# Patient Record
Sex: Female | Born: 1935 | Race: Black or African American | Hispanic: No | Smoking: Never smoker
Health system: Southern US, Community
[De-identification: ages and names within clinical notes are randomized; demographics above are authoritative.]

## PROBLEM LIST (undated history)

## (undated) DIAGNOSIS — N3281 Overactive bladder: Secondary | ICD-10-CM

## (undated) DIAGNOSIS — H409 Unspecified glaucoma: Secondary | ICD-10-CM

## (undated) DIAGNOSIS — I10 Essential (primary) hypertension: Secondary | ICD-10-CM

## (undated) DIAGNOSIS — E785 Hyperlipidemia, unspecified: Secondary | ICD-10-CM

## (undated) DIAGNOSIS — G309 Alzheimer's disease, unspecified: Secondary | ICD-10-CM

## (undated) DIAGNOSIS — F0280 Dementia in other diseases classified elsewhere without behavioral disturbance: Secondary | ICD-10-CM

## (undated) HISTORY — PX: ABDOMINAL HYSTERECTOMY: SHX81

---

## 1997-07-15 ENCOUNTER — Ambulatory Visit (HOSPITAL_COMMUNITY): Admission: RE | Admit: 1997-07-15 | Discharge: 1997-07-15 | Payer: Self-pay | Admitting: Ophthalmology

## 1997-09-01 ENCOUNTER — Ambulatory Visit (HOSPITAL_COMMUNITY): Admission: RE | Admit: 1997-09-01 | Discharge: 1997-09-01 | Payer: Self-pay | Admitting: Ophthalmology

## 1998-05-25 ENCOUNTER — Ambulatory Visit (HOSPITAL_COMMUNITY): Admission: RE | Admit: 1998-05-25 | Discharge: 1998-05-25 | Payer: Self-pay | Admitting: Unknown Physician Specialty

## 1998-06-19 ENCOUNTER — Encounter: Admission: RE | Admit: 1998-06-19 | Discharge: 1998-07-04 | Payer: Self-pay | Admitting: Orthopedic Surgery

## 1999-05-22 ENCOUNTER — Other Ambulatory Visit: Admission: RE | Admit: 1999-05-22 | Discharge: 1999-05-22 | Payer: Self-pay | Admitting: Internal Medicine

## 1999-09-07 ENCOUNTER — Encounter: Payer: Self-pay | Admitting: Ophthalmology

## 1999-09-13 ENCOUNTER — Ambulatory Visit (HOSPITAL_COMMUNITY): Admission: RE | Admit: 1999-09-13 | Discharge: 1999-09-13 | Payer: Self-pay | Admitting: Ophthalmology

## 1999-11-15 ENCOUNTER — Ambulatory Visit (HOSPITAL_COMMUNITY): Admission: RE | Admit: 1999-11-15 | Discharge: 1999-11-15 | Payer: Self-pay

## 1999-11-15 ENCOUNTER — Encounter: Payer: Self-pay | Admitting: Neurology

## 1999-11-15 ENCOUNTER — Encounter: Admission: RE | Admit: 1999-11-15 | Discharge: 1999-11-15 | Payer: Self-pay | Admitting: Neurology

## 2000-02-18 ENCOUNTER — Encounter: Payer: Self-pay | Admitting: Neurology

## 2000-02-18 ENCOUNTER — Ambulatory Visit (HOSPITAL_COMMUNITY): Admission: RE | Admit: 2000-02-18 | Discharge: 2000-02-18 | Payer: Self-pay | Admitting: Neurology

## 2000-03-04 ENCOUNTER — Encounter: Payer: Self-pay | Admitting: Neurology

## 2000-03-04 ENCOUNTER — Ambulatory Visit (HOSPITAL_COMMUNITY): Admission: RE | Admit: 2000-03-04 | Discharge: 2000-03-04 | Payer: Self-pay | Admitting: Neurology

## 2000-03-19 ENCOUNTER — Ambulatory Visit (HOSPITAL_COMMUNITY): Admission: RE | Admit: 2000-03-19 | Discharge: 2000-03-19 | Payer: Self-pay | Admitting: Neurology

## 2000-03-19 ENCOUNTER — Encounter: Payer: Self-pay | Admitting: Neurology

## 2000-04-27 ENCOUNTER — Emergency Department (HOSPITAL_COMMUNITY): Admission: EM | Admit: 2000-04-27 | Discharge: 2000-04-28 | Payer: Self-pay | Admitting: Emergency Medicine

## 2000-04-28 ENCOUNTER — Encounter: Payer: Self-pay | Admitting: Emergency Medicine

## 2000-05-13 ENCOUNTER — Encounter: Payer: Self-pay | Admitting: Neurology

## 2000-05-13 ENCOUNTER — Encounter: Admission: RE | Admit: 2000-05-13 | Discharge: 2000-05-13 | Payer: Self-pay | Admitting: Neurology

## 2000-10-06 ENCOUNTER — Encounter: Admission: RE | Admit: 2000-10-06 | Discharge: 2001-01-04 | Payer: Self-pay | Admitting: Neurology

## 2000-11-11 ENCOUNTER — Encounter: Payer: Self-pay | Admitting: Neurology

## 2000-11-11 ENCOUNTER — Encounter: Admission: RE | Admit: 2000-11-11 | Discharge: 2000-11-11 | Payer: Self-pay | Admitting: Neurology

## 2001-05-12 ENCOUNTER — Other Ambulatory Visit: Admission: RE | Admit: 2001-05-12 | Discharge: 2001-05-12 | Payer: Self-pay | Admitting: Obstetrics and Gynecology

## 2001-10-26 ENCOUNTER — Encounter: Admission: RE | Admit: 2001-10-26 | Discharge: 2001-10-26 | Payer: Self-pay | Admitting: Neurosurgery

## 2001-10-26 ENCOUNTER — Encounter: Payer: Self-pay | Admitting: Pain Medicine

## 2001-12-24 ENCOUNTER — Ambulatory Visit (HOSPITAL_COMMUNITY): Admission: RE | Admit: 2001-12-24 | Discharge: 2001-12-24 | Payer: Self-pay | Admitting: Gastroenterology

## 2002-08-25 ENCOUNTER — Other Ambulatory Visit: Admission: RE | Admit: 2002-08-25 | Discharge: 2002-08-25 | Payer: Self-pay | Admitting: Obstetrics and Gynecology

## 2002-09-10 ENCOUNTER — Ambulatory Visit (HOSPITAL_COMMUNITY): Admission: RE | Admit: 2002-09-10 | Discharge: 2002-09-10 | Payer: Self-pay | Admitting: Internal Medicine

## 2002-09-10 ENCOUNTER — Encounter: Payer: Self-pay | Admitting: Internal Medicine

## 2003-06-24 ENCOUNTER — Encounter: Admission: RE | Admit: 2003-06-24 | Discharge: 2003-06-24 | Payer: Self-pay | Admitting: Neurology

## 2004-01-15 ENCOUNTER — Emergency Department (HOSPITAL_COMMUNITY): Admission: EM | Admit: 2004-01-15 | Discharge: 2004-01-15 | Payer: Self-pay | Admitting: Emergency Medicine

## 2004-09-18 ENCOUNTER — Emergency Department (HOSPITAL_COMMUNITY): Admission: EM | Admit: 2004-09-18 | Discharge: 2004-09-18 | Payer: Self-pay | Admitting: Emergency Medicine

## 2005-01-30 ENCOUNTER — Encounter: Admission: RE | Admit: 2005-01-30 | Discharge: 2005-01-30 | Payer: Self-pay | Admitting: Neurology

## 2005-06-12 ENCOUNTER — Other Ambulatory Visit: Admission: RE | Admit: 2005-06-12 | Discharge: 2005-06-12 | Payer: Self-pay | Admitting: Obstetrics and Gynecology

## 2005-07-01 ENCOUNTER — Encounter: Admission: RE | Admit: 2005-07-01 | Discharge: 2005-07-01 | Payer: Self-pay | Admitting: Cardiology

## 2005-11-29 ENCOUNTER — Encounter: Admission: RE | Admit: 2005-11-29 | Discharge: 2005-11-29 | Payer: Self-pay | Admitting: Internal Medicine

## 2007-03-04 ENCOUNTER — Inpatient Hospital Stay (HOSPITAL_COMMUNITY): Admission: EM | Admit: 2007-03-04 | Discharge: 2007-03-07 | Payer: Self-pay | Admitting: Emergency Medicine

## 2007-03-04 ENCOUNTER — Ambulatory Visit: Payer: Self-pay | Admitting: Cardiology

## 2007-03-04 ENCOUNTER — Ambulatory Visit: Payer: Self-pay | Admitting: Cardiovascular Disease

## 2007-03-04 ENCOUNTER — Encounter (INDEPENDENT_AMBULATORY_CARE_PROVIDER_SITE_OTHER): Payer: Self-pay | Admitting: *Deleted

## 2007-03-04 ENCOUNTER — Ambulatory Visit: Payer: Self-pay | Admitting: *Deleted

## 2007-09-17 ENCOUNTER — Emergency Department (HOSPITAL_COMMUNITY): Admission: EM | Admit: 2007-09-17 | Discharge: 2007-09-17 | Payer: Self-pay | Admitting: Pediatrics

## 2009-06-28 ENCOUNTER — Emergency Department (HOSPITAL_COMMUNITY): Admission: EM | Admit: 2009-06-28 | Discharge: 2009-06-28 | Payer: Self-pay | Admitting: Emergency Medicine

## 2010-02-16 ENCOUNTER — Emergency Department (HOSPITAL_COMMUNITY): Admission: EM | Admit: 2010-02-16 | Discharge: 2010-02-16 | Payer: Self-pay | Admitting: Emergency Medicine

## 2010-04-29 ENCOUNTER — Encounter: Payer: Self-pay | Admitting: Internal Medicine

## 2010-06-19 LAB — URINALYSIS, ROUTINE W REFLEX MICROSCOPIC
Hgb urine dipstick: NEGATIVE
Ketones, ur: NEGATIVE mg/dL
Protein, ur: NEGATIVE mg/dL
Urobilinogen, UA: 0.2 mg/dL (ref 0.0–1.0)

## 2010-06-19 LAB — DIFFERENTIAL
Eosinophils Absolute: 0.1 10*3/uL (ref 0.0–0.7)
Eosinophils Relative: 2 % (ref 0–5)
Lymphocytes Relative: 40 % (ref 12–46)
Monocytes Relative: 9 % (ref 3–12)
Neutro Abs: 1.9 10*3/uL (ref 1.7–7.7)

## 2010-06-19 LAB — CBC
HCT: 39.9 % (ref 36.0–46.0)
Hemoglobin: 13.5 g/dL (ref 12.0–15.0)
MCH: 31 pg (ref 26.0–34.0)
Platelets: 223 10*3/uL (ref 150–400)
RBC: 4.35 MIL/uL (ref 3.87–5.11)
RDW: 15.1 % (ref 11.5–15.5)

## 2010-06-19 LAB — URINE MICROSCOPIC-ADD ON

## 2010-06-19 LAB — BASIC METABOLIC PANEL
BUN: 11 mg/dL (ref 6–23)
GFR calc Af Amer: >60 mL/min (ref >60)
GFR calc non Af Amer: >60 mL/min (ref >60)

## 2010-07-02 LAB — CBC
Platelets: 235 10*3/uL (ref 150–400)
RBC: 4.17 MIL/uL (ref 3.87–5.11)
RDW: 14.8 % (ref 11.5–15.5)
WBC: 3.8 10*3/uL — ABNORMAL LOW (ref 4.0–10.5)

## 2010-07-02 LAB — POCT CARDIAC MARKERS
CKMB, poc: <1 ng/mL — ABNORMAL LOW (ref 1.0–8.0)
Myoglobin, poc: 44.6 ng/mL (ref 12–200)
Troponin i, poc: <0.05 ng/mL (ref 0.00–0.09)

## 2010-07-02 LAB — COMPREHENSIVE METABOLIC PANEL
CO2: 25 mEq/L (ref 19–32)
Chloride: 104 mEq/L (ref 96–112)
GFR calc Af Amer: >60 mL/min (ref >60)
Glucose, Bld: 120 mg/dL — ABNORMAL HIGH (ref 70–99)
Sodium: 135 mEq/L (ref 135–145)

## 2010-07-02 LAB — DIFFERENTIAL
Basophils Relative: 1 % (ref 0–1)
Lymphs Abs: 1.5 10*3/uL (ref 0.7–4.0)
Monocytes Absolute: 0.3 10*3/uL (ref 0.1–1.0)
Monocytes Relative: 8 % (ref 3–12)
Neutro Abs: 2 10*3/uL (ref 1.7–7.7)

## 2010-08-06 DIAGNOSIS — F0390 Unspecified dementia without behavioral disturbance: Secondary | ICD-10-CM

## 2010-08-21 NOTE — Discharge Summary (Signed)
NAMERASHAUNA, TEP             ACCOUNT NO.:  [id number]   MEDICAL RECORD NO.:  [id number]          PATIENT TYPE:  INP   LOCATION:  4730                         FACILITY:  [facility]   PHYSICIAN:  Mobolaji B. Bakare, M.D.DATE OF BIRTH:  1922/03/14   DATE OF ADMISSION:  03/04/2007  DATE OF DISCHARGE:  03/07/2007                               DISCHARGE SUMMARY   PRIMARY CARE PHYSICIAN:  Olene Craven, M.D.   FINAL DIAGNOSIS:  1. Headache unclear etiology. Probably related to intra- articular      steroid injection  2. Chest pain with mildly elevated troponin probably secondary to      hypertensive crisis.  3. Hypertensive crisis.  4. History of left cerebellopontine angle acoustic neuroma.  5. Glaucoma.  6. Osteoarthritis.  7. Sinus bradycardia asymptomatic.   PROCEDURES:  1. Head CT scan done on the March 04, 2007 showed no acute      intracranial abnormality, a stable tiny punctate bilateral lacunar      infarct of the basilar ganglia, and  stable periventricular white      matter disease.  2. CT angiogram of the chest showed by the basilar atelectasis, no      evidence of pulmonary embolism or other acute thoracic process.  3. MRA of the head showed mild intracranial atherosclerotic-type      changes.  4. MRI of the head showed no acute infarct, residual of left-sided      acoustic neuroma as a relatively similar appearance to MRI of 2006.      Chest x-ray done on February 12, 2007, mL showed cardiomegaly      without pulmonary edema, bibasilar atelectasis.  5. A 2-D echocardiogram done on the February 12, 2007,  showed normal      left ventricular systolic function ejection fraction estimated to      be 55%.  Left ventricular wall thickness was mildly increased,      mildly calcified aortic valve with mild aortic valvular      regurgitation, and mild mitral valvular regurgitation.   CONSULTS:  Cardiology consult.   BRIEF HISTORY:  Please refer to the admission H&P.   In brief, Ms. Blackard  is an 77 year old African-American female who lives independently at  home.  She presented to the emergency room on the day of admission with  severe headache.  She apparently started her day with a clinic  evaluation by Dr. August Saucer, on this particular day, for osteoarthritis.  She  had intra-articular injection of steroid to her left hip.  On arrival at  home, the patient developed slight headache.  She took some aspirin and  went to bed.  She was woken up by a very severe headache on this day, at  about 2:00 a.m. which persisted when she arrived in the emergency room.  Headache improved after IV morphine.  It was noted, on arrival in the  emergency room, and also by the EMS that her blood pressure was  elevated.  Blood pressure on arrival was 201/113.  This resolved after  IV morphine and labetalol.  The patient had a head CT  scan which was  unrevealing for any acute abnormality.  She was admitted for further  evaluation with an MRI of the head.   Additionally, the patient during transportation by EMS developed chest  pressure associated with some shortness of breath, and there were no EKG  changes.  Skilled cardiac markers at the point of care were normal.  The  patient was also admitted to rule out MI.  D-dimer was requested.   HOSPITAL COURSE:  Problem #1:  Headaches. This headache was quite severe  that she rated it as 10/10 when it started, but this resolved after IV  morphine.  She had a head CT scan which did not show any subarachnoid  bleed.  This was followed by an MRI which showed no acute findings,  although she has history of left acoustic neuroma which has been treated  with gamma knife radiation.  She has had a follow-up evaluation in [place] this past summer, and everything was said to be stable.  It  should be mentioned that evaluation in the emergency room with a  tonometry revealed increased intraocular pressure of 30 mm bilaterally.  The  patient denies any eye pain.  She does have history of glaucoma, and  she has been on her medication.  She had no changes in vision during  this whole episode.  I have advised her to see Dr. Elmer Picker  (ophthalmologist) early next week post discharge.  There was no aneurysm  noted on MRA.  The patient had no neck stiffness no rigidity, fever, or  elevated white cell count to suggest an infection; hence, this was not  pursued further.  Symptoms resolved in the emergency room.  This raises  the question of having had a reaction to the intra-articular steroid.  I  have tried contacting Dr. August Saucer, it was a holiday weekend.  I have asked  the patient to followup with him, and mention these events during her  next visit.   Problem #2:  HYPERTENSION/HYPERTENSIVE CRISIS.  The patient denies any  history of hypertension.  She is not on any antihypertensive.  Her blood  pressure was quite elevated during the episode of severe headache.  On  initial arrival in the emergency room blood pressure was 201/113.  At  the time of my evaluation, blood pressure was 145/90.  This was after  she received IV morphine and labetalol 20 mg IV.  Subsequently during  the course of hospitalization, blood pressure has ranged between April 30, 1975 to 145/86.  The patient was seen in consultation by Dr. Daleen Squibb.  She has mild mildly increased left ventricular wall thickness.  She has  LVH by voltage criteria EKG it was felt that she should be a low  threshold to treat for hypertension with low dose hydrochlorothiazide.  This was discussed with the patient.  She is reluctant to start  antihypertensive, now, she claims that she has always checked her blood  pressure at home and it has been normal. It was suggested that she  should follow up with Dr. Barbee Shropshire and have this rechecked, again, and  will defer decision to start antihypertensive to Dr. Barbee Shropshire.   Problem #3:  CHEST PAIN WITH SLIGHTLY ELEVATED TROPONIN.  The  patient  was evaluated with a D-dimer, and this turned out to be elevated.  She  underwent a CT angiogram of the chest to rule out PE or dissection which  was felt to be less likely.  CT angiogram of the  chest was normal.  She  had serial cardiac enzymes which revealed slightly increased CK-MB of  3.1 and troponin of 0.11 with the first set, and the third set trended  down with a troponin 0.06 and CK-MB of 2.6.  This prompted cardiology  consult, and Dr. Daleen Squibb opinioned that this was probably a nonobstructive  CAD, and the bump in troponin was probably secondary to hypertensive  crisis, and not acute coronary syndrome.  He felt that well one should  have a low threshold to hypertension given all these data, and the  patient should be on Ecasa 81 mg daily; and  no need for stress test at  this time.   Initial EKG was unrevealing for acute ST changes.  There was normal  sinus rhythm with LVH by voltage criteria.  QTC interval was normal.  The patient was noted to have resting sinus bradycardia while on  telemetry.   The patient had a fasting lipid profile which showed total cholesterol  of 195, LDL of 137, HDL of 51.  Given other risk factors the goals  should be less than 130.  The patient agrees to use diet to control the  cholesterol, at this point.  She should have a repeat fasting lipid  profile checked in 3 months.   Problem #4:  GLAUCOMA.  The patient is to followup with her  ophthalmologist early next week.   DISCHARGE MEDICATIONS:  1. Vesicare 5 mg nightly  2. Namenda 10 mg b.i.d.  3. Alphagan 1 drop each eye t.i.d.  4. Lumigan 0.03% nightly.  5. Trusopt 1 drop b.i.d.  6. Tramadol p.r.n.  7. Ecasa 81 mg daily.   FOLLOWUP:  With Dr. Barbee Shropshire in 1 week.   RECOMMENDATIONS:  1. Check first lipid profile in 3 months.  2. Reevaluate blood pressure and need for an antihypertensive.   DISCHARGE CONDITION:  Stable.  The patient ambulating without  difficulty.  Symptoms are  all resolved.   VITALS AT THE TIME OF DISCHARGE:  Blood pressure 145/86, respiratory  rate of 18, pulse of 56, temperature 98.1 and O2 saturation 99% on room  air.      Mobolaji B. Corky Downs, M.D.  Electronically Signed     MBB/MEDQ  D:  03/07/2007  T:  03/07/2007  Job:  [id number]   cc:   Olene Craven, M.D.  Burnard Bunting, M.D.  Candyce Churn. Allyne Gee, M.D.

## 2010-08-21 NOTE — Consult Note (Signed)
NAMEJAYLAN, Gregory             ACCOUNT NO.:  [id number]   MEDICAL RECORD NO.:  [id number]          PATIENT TYPE:  INP   LOCATION:  4730                         FACILITY:  [facility]   PHYSICIAN:  Robin Sans. Wall, MD, FACCDATE OF BIRTH:  [birth year]   DATE OF CONSULTATION:  DATE OF DISCHARGE:                                 CONSULTATION   We were asked by Dr. Wilhemina Gregory of the [facility] service to evaluate  Robin Gregory with some mild increase in her troponins.   HISTORY OF PRESENT ILLNESS:  Ms.  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   She woke up about 2:00 a.m. with severe throbbing headache.  She had no  other associated symptoms except for some mild chest pressure en route  to the hospital.  Her blood pressure was next initially 180 and then it  was 201/113.  It has now come down and has been under good control since  then.  She is on no antihypertensives at present.   Her cardiac markers have shown normal CPK MB but mildly elevated  troponins.  Her peak troponin was 0.11 with her last troponin being  0.06.  Note the C-reactive protein was low as was her sed rate.  Her  hemoglobin A1c was also normal.  Her BNP was elevated at 258 when she  first appeared.  D-dimer was elevated but a CT scan showed no evidence  of pulmonary emboli.  Chest x-ray shows some minimal cardiomegaly.  EKG  shows LVH moderate criteria.  Her 2-D echocardiogram shows mild LVH with  mild mitral regurgitation and mild aortic insufficiency.  Her left  ventricular walls measure 11.8 and 10.2.  She had normal left  ventricular systolic function.   PAST MEDICAL HISTORY:  She denies any history of hypertension.  She has  a history of polyarticular arthritis.  She had a posterior fossa  meningioma diagnosed to 2001.  She underwent gamma knife radiation at  [facility].  An MRI scan here show the  residua of this tumor.  She has a history of glaucoma, dementia, urinary  incontinence.   MEDICATIONS ON ADMISSION:  1. Vesicare 5 mg q.h.s.  2. Tramadol p.r.n.  3. Namenda 10 mg p.o. b.i.d.  4. Alphagan 1 drop in each eye 3 times a day.  5. Lumigan 0.03% q.h.s.  6. Trusopt 2 times a day.   ALLERGIES:  NUBAIN CAUSES SWELLING.   FAMILY HISTORY:  Noncontributory.   SOCIAL HISTORY:  Lives at home.  She is independent.  She is very active  in her church.  She does not smoke or drink.  She apparently uses a lot  of salt according to her daughters who are in the room.   REVIEW OF SYSTEMS:  Her review of systems other than HPI are negative.  She has no exertional angina at home.  She has no limitations from my  shortness of breath.   PHYSICAL EXAMINATION:  VITAL SIGNS:  Her current exam shows blood  pressure 133/80, pulse is 57 and she is in sinus rhythm, sinus brady,  temperature is 97.6, respirations 18, O2 sats 96%.  HEENT:  Normocephalic, atraumatic.  PERLA.  Extraocular movements  intact.  Sclerae are clear.  Face symmetry is normal.  GENERAL:  She is alert and oriented.  NECK:  Carotid upstrokes are equal bilateral without bruits.  No JVD.  Thyroid is not enlarged.  Trachea is midline.  LUNGS:  Clear.  HEART:  Reveals a dynamic PMI.  She has a normal S1 without an S4.  ABDOMEN:  Abdominal exam is soft, good bowel sounds.  No midline bruit.  EXTREMITIES:  No cyanosis, clubbing or edema.  Pulses are rather brisk  2+/4+ bilaterally symmetrical.  NEUROLOGIC:  Neuro exam is grossly intact.   ASSESSMENT:  1. Minimal increase in troponins with normal CPK-MB consistent with      myocardial strain from hypertensive crisis.  I do not think this is      acute coronary syndrome.  She is totally asymptomatic from an      exertional standpoint as well.  2. Probable labile hypertension.  Her EKG and echocardiogram would       support this.  3. Dietary indiscretion.  4. Probable nonobstructive coronary disease at her age.   I had a long talk with her and her family.  She clearly does not want a  stress test nor does her family want her to have one.  I am not sure  this would add anything at all to her current management.  She knows  what the symptoms of angina and knows to report them.   I would treat her with aspirin 81 mg a day.  I think her blood pressure  probably needs be treated as well and I would start with something like  HCTZ 12.5 mg a day.  She this cleared from cardiology standpoint to go  home.      Robin C. Daleen Squibb, MD, [facility]  Electronically Signed     TCW/MEDQ  D:  03/06/2007  T:  03/06/2007  Job:  [id number]   cc:   Robin Gregory, M.D.

## 2010-08-21 NOTE — H&P (Signed)
NAME:  Robin Gregory, Robin Gregory             ACCOUNT NO.:  [id number]   MEDICAL RECORD NO.:  [id number]          PATIENT TYPE:  EMS   LOCATION:  MAJO                         FACILITY:  [facility]   PHYSICIAN:  Mobolaji B. Bakare, M.D.DATE OF BIRTH:  1921/05/12   DATE OF ADMISSION:  03/04/2007  DATE OF DISCHARGE:                              HISTORY & PHYSICAL   PRIMARY CARE PHYSICIAN:  Olene Craven, M.D.   ORTHOPEDIC SURGEON:  Burnard Bunting, M.D.   OPHTHALMOLOGIST:  Delon Sacramento, M.D.   CHIEF COMPLAINT:  Headache.   HISTORY OF PRESENTING COMPLAINT:  Robin Gregory is a pleasant, 77 year old  African-American female, who was in her usual state of health until  yesterday evening, when she developed headache, both frontal regions.  She went to sleep with this headache.  Unfortunately, she was awakened  about 2 a.m. with severe throbbing headache, which she rated as 15/10.  There were no associated changes in her vision, no nausea or vomiting.  There was no neck pain, no photophobia or phonophobia.  Patient called  EMS and she was brought to the hospital.  Prior to arrival by EMS,  patient checked out blood pressure.  It was 180 systolic and arrival by  EMS, blood pressure was still elevated.  While in the emergency room,  blood pressure was initially 201/113.  She has been medicated with IV  labetalol and morphine.  Blood pressure is now 145/90.  She still has  headache, but it is much better.  She rates it as 7/10.   En route to the hospital, while in the ambulance, patient developed  retrosternal chest pressure, associated with some shortness of breath,  nausea, but no diaphoresis, palpitations, or tingling sensation.  Patient had a normal EKG in the emergency room without acute ST changes.  Full set of cardiac markers at the point of care was normal.  Chest  pressure is almost gone.   Patient was seen at Dr. Diamantina Providence office yesterday for a sprain in her left  ankle.  She accidentally  fell and sprained her left ankle.  She got a  cortisone shot to this ankle and left hip.  She has history of severe  arthritis.   REVIEW OF SYSTEMS:  She denies eye pain.  No vomiting, diarrhea,  constipation, orthopnea, PND.  No dysuria, urgency, or increased  frequency of micturition.   PAST MEDICAL HISTORY:  Significant for:  1. Polyarticular arthritis.  2. Posterior fossa meningioma, diagnosed in 2001.  She underwent gamma      knife radiation at [facility] [place] [employer] [facility].  She      had a followup evaluation this past summer and patient states that      everything was okay.  She still has small meningioma, though.  3. Glaucoma.  4. Dementia.  5. Urinary incontinence.   CURRENT MEDICATIONS:  1. Vesicare 5 mg q.h.s.  2. Tramadol p.r.n.  This was started yesterday by Dr. August Saucer.  3. Namenda 10 mg b.i.d.  4. Alphagan P one drop each eye three times a day.  5. Lumigan 0.03% q.h.s.  6. Trusopt two  times a day.   ALLERGIES:  NUBAIN causes swelling.   FAMILY HISTORY:  Noncontributory.  She has a daughter who has  meningioma, which is also being followed up.   SOCIAL HISTORY:  Patient lives at home.  She is independent with  activities of daily living.  She does not smoke cigarettes or drink  alcohol.   INITIAL VITALS:  Temperature 97.1, blood pressure 201/113, now 145/90,  pulse of 86, respiratory rate 18, O2 sats 97% to 100% on room air.  EXAMINATION:  Patient is awake, alert and oriented in time, place and  person.  Normocephalic, atraumatic head.  Pupils were equal, round and reactive  to light.  She has fairly opaque sclerae.  Neck:  No elevated JVD, no  carotid bruit.  Mucous membranes moist, no oral thrush.  Patient has no  neck stiffness.  LUNGS:  Clear clinically to auscultation.  CVS:  S1, S2.  Regular.  ABDOMEN:  Not distended, soft, nontender, bowel sounds present.  EXTREMITIES:  There is swelling, left calf, and pitting edema involving  left ankle  and swelling involving left ankle, which is tender.  She has  a crepe bandage around the left ankle.  Dorsalis pedis pulses 2+  bilaterally.  CNS:  No focal neurological deficit.  Cranial nerves II through XII are  intact.  Motor power 5/5 in all limbs, no pronator drift.   RADIOLOGICAL DATA:  Chest x-ray showed cardiomegaly, no edema, bibasilar  atelectasis.   CT scan of the head showed no acute intracranial abnormalities, stable  small bilateral remote lacunar infarct of basal ganglia.  Stable  periventricular white matter disease.   INITIAL LABORATORY DATA:  White cell count 6.6, hemoglobin 14.1,  hematocrit 41.2, platelets 238, neutrophils 84%, absolute neutrophil  count is normal.  Cardiac markers at the point of care:  CK-MB 1.8,  myoglobin 116.  Sodium 139, potassium 3.8, chloride 107, glucose 145,  BUN 17, creatinine 0.8, bicarb 20.   EKG shows normal sinus rhythm with heart rate of 83 beats per minute,  normal interval.   ASSESSMENT AND PLAN:  Robin Gregory is an 77 year old African-American  female, presenting with severe headaches, elevated blood pressure.  She  has no acute abnormality on head CT scan.  She will be admitted for  further evaluation and treatment.   ADMISSION DIAGNOSIS:  1. Severe headache:  Etiology of this is unclear.  It is hard to      conclude that this is secondary to the severely elevated blood      pressure.  Patient has no history of hypertension prior to this      hospitalization.  Blood pressure has been well-controlled with pain      medication and IV labetalol.  We will check sed rates to rule out      temporal arteritis.  MRI, and MRA of the head to further assess      remaining tumor and also rule out a new lesion.  We will give      symptomatic treatment with Tylenol and Vicodin p.r.n.  If no      improvement, would consult neurology.  2. Elevated blood pressure:  We will monitor blood pressure while in      the hospital.  Should this  remain persistently elevated, will start      antihypertensive, will control pain and assess blood pressure      again.  3. Left-ankle sprain with left-calf swelling:  Will rule out lower  extremity DVT, obtain left lower extremity Dopplers.  Will continue      pain management for the sprain.  4. Chest pain:  She has a normal EKG.  Will cycle cardiac enzymes.      Check D-dimer.  If this is positive, we will obtain a CT scan of      the chest.  Check fasting lipid profile, hemoglobin A1c and 2D      echocardiogram.  5. Glaucoma, both eyes:  Will resume Lumigan, Alphagan and Trusopt.  6. Dementia:  We will resume Namenda.      Mobolaji B. Corky Downs, M.D.  Electronically Signed     MBB/MEDQ  D:  03/04/2007  T:  03/04/2007  Job:  [id number]   cc:   Olene Craven, M.D.  Burnard Bunting, M.D.  Delon Sacramento, M.D.

## 2010-08-24 NOTE — Op Note (Signed)
[place]  Patient:    Robin Gregory, Robin Gregory                    MRN: [id number] Adm. Date:  [id number] Disc. Date: [id number] Attending:  Karenann Cai                           Operative Report  PREOPERATIVE DIAGNOSIS:  Immature cataract, right eye.  POSTOPERATIVE DIAGNOSIS:  Immature cataract, right eye.  OPERATION:  Kelman phacoemulsification cataract, right eye with intraocular lens implantation.  ANESTHESIA:  Local using 2% Xylocaine with Marcaine 0.75% and Wydase.  JUSTIFICATION FOR PROCEDURE:  This is a 77 year old lady who complains of blurring of vision with difficulty seeing to read and difficulty seeing to drive at night.  She was evaluated and found to have bilateral cataracts, worse on the left than the right.  Vision acuity was best corrected to 20/50 on the right and 20/40 on the left.  Cataract extraction of the right eye was recommended, and she is admitted at this time for the proposed procedure.  DESCRIPTION OF PROCEDURE:  Under the influence of IV sedation, the Van Lint akinesia and retrobulbar anesthesia were given.  The patient was prepped and draped in the usual manner.  The lid speculum was inserted under the upper and lower lids of the right eye, and a full silk traction suture was passed through the belly of the superior rectus muscle with traction.  A fornix-based conjunctival flap was turned, and hemostasis was achieved by using cautery. An incision was made in the sclera, approximately 1 mm posterior to the limbus. This incision was dissected down into clear cornea using the crescent blade.  A stab wound incision was made at the 1:30 oclock position.  OcuCoat was injected into the eye through the stab wound incision.  The anterior chamber was then entered through the corneoscleral tunnel incision at the 11:30 oclock position.  An anterior capsulotomy was done using a bent 25-gauge needle.  The nucleus was  hydrodissected using Xylocaine.  The ______ handpiece was passed into the eye, and the nucleus was emulsified without difficulty.  The residual ______ was aspirated.  The posterior capsule was polished using the olive tip polisher.  The wound was then widened slightly to accommodate a 5 x 6 orbital intraocular lens.  This lens was seated into the eye behind the iris without difficulty.  The anterior chamber was reformed, and the pupil was constricted using Miochol.  The corneosclera wound was then closed by using a single horizontal suture of 10-0 nylon.  After it was ascertained that the wound was airtight and watertight, the conjunctiva was closed using thermocautery.  Then 1 cc of Celestone and a 0.5 cc of gentamicin were injected into the subconjunctivae.  Maxitrol Ophthalmic ointment and Polocaine ointment were applied, along was a patch and a Fox shield.  The patient tolerated the procedure well and was discharged to the post anesthesia recovery room in satisfactory condition.  She is to be discharged when stable with instructions to rest in bed today, take Vicodin q.4h. as needed for the pain, and to see me in the office tomorrow for further evaluation.  DISCHARGE DIAGNOSIS:  Immature cataract, right eye. DD:  09/13/99 TD:  09/18/99 Job: [id number]

## 2010-08-24 NOTE — Op Note (Signed)
NAME:  Robin Gregory, Robin Gregory                       ACCOUNT NO.:  [id number]   MEDICAL RECORD NO.:  [id number]                   PATIENT TYPE:  AMB   LOCATION:  ENDO                                 FACILITY:  [facility]   PHYSICIAN:  Danise Edge, MD                  DATE OF BIRTH:  [birth year]   DATE OF PROCEDURE:  12/24/2001  DATE OF DISCHARGE:                                 OPERATIVE REPORT   PROCEDURE PERFORMED:  Colonoscopy.   ENDOSCOPIST:  Charolett Bumpers, M.D.   REFERRING PHYSICIAN:  Lindell Spar. Chestine Spore, M.D.   INDICATIONS FOR PROCEDURE:  The patient is a 77 year old female with  functional type constipation which had developed since her surgery for  spinal stenosis.  Ms. Yellin tells me she has been undergoing surveillance  colonoscopies every two years since removal of a noncancerous colon polyp  years ago.  I placed her on MiraLax without significant improvement in her  functional type constipation.  A screening colonoscopy is scheduled today.   PREMEDICATION:  Fentanyl 50 mcg, Versed 5 mg.   INSTRUMENT USED:  Pediatric Olympus video colonoscope.   DESCRIPTION OF PROCEDURE:  After obtaining informed consent, the patient was  placed in the left lateral decubitus position.  I administered intravenous  fentanyl and intravenous Versed to achieve conscious sedation for the  procedure.  The patient's blood pressure, oxygen saturations and cardiac  rhythm were monitored throughout the procedure and documented in the medical  record.   Anal inspection was normal.  Digital rectal exam was normal.  The pediatric  Olympus video colonoscope was introduced into the rectum and advanced to the  cecum.   Unfortunately the patient's colonic lavage prep did not adequately prep the  colon for accurate visualization of a colonic mucosa to exclude colon  polyps.  The colon prep was adequate enough to accurately rule out colonic  obstruction.   Rectum:  Normal.   Sigmoid colon and  descending colon:  Normal.   Splenic flexure:  Normal.   Transverse colon:  Normal.   Hepatic flexure:  Normal.   Ascending colon:  Normal.   Ileocecal valve and cecum:  Normal.   ASSESSMENT:  1. Inadequately prepped colon for colonic mucosal evaluation to rule out     colonic polyps.  2. No endoscopic evidence for the presence of colonic obstruction.   RECOMMENDATIONS:  I will place the patient on Citracal and Senokot.                                                  Danise Edge, MD    MJ/MEDQ  D:  12/24/2001  T:  12/25/2001  Job:  [id number]   cc:   Lindell Spar. Chestine Spore, M.D.  [address], [address]  [place]  [place] [address]  Fax: [phone]

## 2011-01-03 LAB — URINALYSIS, ROUTINE W REFLEX MICROSCOPIC
Glucose, UA: 100 — AB
Ketones, ur: NEGATIVE
Leukocytes, UA: NEGATIVE
Nitrite: NEGATIVE
Urobilinogen, UA: 0.2

## 2011-01-03 LAB — DIFFERENTIAL
Eosinophils Absolute: 0
Eosinophils Relative: 0
Monocytes Absolute: 0.2
Monocytes Relative: 3
Neutrophils Relative %: 82 — ABNORMAL HIGH

## 2011-01-03 LAB — URINE MICROSCOPIC-ADD ON

## 2011-01-03 LAB — POCT I-STAT, CHEM 8
BUN: 18
Calcium, Ion: 1.16
Glucose, Bld: 145 — ABNORMAL HIGH
Potassium: 3.6
Sodium: 142

## 2011-01-03 LAB — CBC
Hemoglobin: 13.8
Platelets: 201
WBC: 6.3

## 2011-01-15 LAB — I-STAT 8, (EC8 V) (CONVERTED LAB)
Acid-base deficit: 3 — ABNORMAL HIGH
BUN: 17
Bicarbonate: 20.1
Glucose, Bld: 145 — ABNORMAL HIGH
Potassium: 3.8
TCO2: 21
pCO2, Ven: 31.2 — ABNORMAL LOW
pH, Ven: 7.416 — ABNORMAL HIGH

## 2011-01-15 LAB — CBC
Hemoglobin: 14.1
MCHC: 33.6
MCV: 89.3
Platelets: 238
RBC: 4.68
RDW: 14.5

## 2011-01-15 LAB — BASIC METABOLIC PANEL
BUN: 20
Chloride: 105
Creatinine, Ser: 0.94
GFR calc Af Amer: >60
GFR calc non Af Amer: 57 — ABNORMAL LOW

## 2011-01-15 LAB — HEPATIC FUNCTION PANEL
ALT: 19
AST: 22
Albumin: 3.3 — ABNORMAL LOW
Alkaline Phosphatase: 75
Total Protein: 6.1

## 2011-01-15 LAB — CARDIAC PANEL(CRET KIN+CKTOT+MB+TROPI)
Relative Index: 2.2
Relative Index: INVALID
Total CK: 103
Troponin I: 0.06
Troponin I: 0.09 — ABNORMAL HIGH

## 2011-01-15 LAB — DIFFERENTIAL
Lymphs Abs: 0.9
Monocytes Absolute: 0.1
Monocytes Relative: 1 — ABNORMAL LOW
Neutrophils Relative %: 84 — ABNORMAL HIGH

## 2011-01-15 LAB — D-DIMER, QUANTITATIVE: D-Dimer, Quant: 1.84 — ABNORMAL HIGH

## 2011-01-15 LAB — LIPID PANEL
Cholesterol: 195
HDL: 51
LDL Cholesterol: 137 — ABNORMAL HIGH

## 2011-01-15 LAB — TSH: TSH: 0.347 — ABNORMAL LOW

## 2011-01-15 LAB — HEMOGLOBIN A1C: Mean Plasma Glucose: 136

## 2011-01-15 LAB — POCT I-STAT CREATININE: Creatinine, Ser: 0.8

## 2011-01-15 LAB — POCT CARDIAC MARKERS: Troponin i, poc: <0.05

## 2012-07-05 ENCOUNTER — Encounter (HOSPITAL_COMMUNITY): Payer: Self-pay | Admitting: Emergency Medicine

## 2012-07-05 ENCOUNTER — Emergency Department (HOSPITAL_COMMUNITY)
Admission: EM | Admit: 2012-07-05 | Discharge: 2012-07-06 | Disposition: A | Discharge status: Home / Self Care | Payer: Medicare Other | Attending: Emergency Medicine | Admitting: Emergency Medicine

## 2012-07-05 DIAGNOSIS — Z7982 Long term (current) use of aspirin: Secondary | ICD-10-CM | POA: Insufficient documentation

## 2012-07-05 DIAGNOSIS — Z79899 Other long term (current) drug therapy: Secondary | ICD-10-CM | POA: Insufficient documentation

## 2012-07-05 DIAGNOSIS — R51 Headache: Secondary | ICD-10-CM | POA: Insufficient documentation

## 2012-07-05 DIAGNOSIS — J3489 Other specified disorders of nose and nasal sinuses: Secondary | ICD-10-CM | POA: Insufficient documentation

## 2012-07-05 DIAGNOSIS — Z8669 Personal history of other diseases of the nervous system and sense organs: Secondary | ICD-10-CM | POA: Insufficient documentation

## 2012-07-05 HISTORY — DX: Unspecified glaucoma: H40.9

## 2012-07-05 MED ORDER — ACETAMINOPHEN 325 MG PO TABS
650.0000 mg | ORAL_TABLET | Freq: Once | ORAL | Status: AC
  Administered 2012-07-05: 650 mg via ORAL
  Filled 2012-07-05: qty 2

## 2012-07-05 NOTE — ED Notes (Signed)
Pt from [place] c/o HA x 1.5 days. No meds administered at [facility].

## 2012-07-06 ENCOUNTER — Emergency Department (HOSPITAL_COMMUNITY): Payer: Medicare Other

## 2012-07-06 MED ORDER — ACETAMINOPHEN 500 MG PO TABS: 500.0000 mg | ORAL_TABLET | Freq: Four times a day (QID) | ORAL | Status: DC | PRN

## 2012-07-06 MED ORDER — OXYMETAZOLINE HCL 0.05 % NA SOLN
1.0000 | Freq: Once | NASAL | Status: AC
  Administered 2012-07-06: 1 via NASAL
  Filled 2012-07-06: qty 15

## 2012-07-06 MED ORDER — KETOROLAC TROMETHAMINE 30 MG/ML IJ SOLN
30.0000 mg | Freq: Once | INTRAMUSCULAR | Status: AC
  Administered 2012-07-06: 30 mg via INTRAMUSCULAR
  Filled 2012-07-06: qty 1

## 2012-07-06 MED ORDER — FLUTICASONE PROPIONATE 50 MCG/ACT NA SUSP: 2.0000 | Freq: Every day | NASAL | Status: AC

## 2012-07-06 MED ORDER — GUAIFENESIN ER 600 MG PO TB12: 600.0000 mg | ORAL_TABLET | Freq: Two times a day (BID) | ORAL | Status: DC

## 2012-07-06 MED ORDER — IBUPROFEN 400 MG PO TABS: 400.0000 mg | ORAL_TABLET | Freq: Three times a day (TID) | ORAL | Status: DC | PRN

## 2012-07-06 NOTE — ED Provider Notes (Signed)
History     CSN: [id number]  Arrival date & time 07/05/12  2307   First MD Initiated Contact with Patient 07/05/12 2356      Chief Complaint  Patient presents with  . Headache   HPI  History provided by the patient and family. Patient is 79 year old female with past history is of glaucoma who presents with complaints of continued and worsening headache symptoms. Patient reports having headache for the past few days. It was improving with Tylenol use but today and this evening has continued and worsened. She has also had some associated recent symptoms of rhinorrhea and congestion with sinus pressure in her face. Headache pain is across the 4 head bilaterally. Does not radiate to the back of the head or down. She denies any associated vision changes. No photophobia. No associated fever, chills or sweats. No nausea vomiting. No weakness or numbness in extremities. No confusion or speech difficulty. There are no other aggravating or alleviating factors. No other associated symptoms.    Past Medical History  Diagnosis Date  . Glaucoma     Past Surgical History  Procedure Laterality Date  . Abdominal hysterectomy      No family history on file.  History  Substance Use Topics  . Smoking status: Never Smoker   . Smokeless tobacco: Never Used  . Alcohol Use: No    OB History   Grav Para Term Preterm Abortions TAB SAB Ect Mult Living                  Review of Systems  Constitutional: Negative for fever and chills.  HENT: Positive for congestion, rhinorrhea and sinus pressure.   Eyes: Negative for photophobia and visual disturbance.  Respiratory: Negative for cough and shortness of breath.   Cardiovascular: Negative for chest pain.  Gastrointestinal: Negative for nausea and vomiting.  Neurological: Positive for headaches. Negative for weakness and numbness.  Psychiatric/Behavioral: Negative for confusion.  All other systems reviewed and are negative.    Allergies   Review of patient's allergies indicates no known allergies.  Home Medications   Current Outpatient Rx  Name  Route  Sig  Dispense  Refill  . acetaminophen (TYLENOL) 325 MG tablet   Oral   Take 650 mg by mouth every 6 (six) hours as needed for pain (arthritis).         Marland Kitchen aspirin 81 MG chewable tablet   Oral   Chew 81 mg by mouth daily.         . bimatoprost (LUMIGAN) 0.01 % SOLN   Both Eyes   Place 1 drop into both eyes at bedtime.         . cholecalciferol (VITAMIN D) 1000 UNITS tablet   Oral   Take 2,000 Units by mouth daily.         Marland Kitchen donepezil (ARICEPT) 10 MG tablet   Oral   Take 10 mg by mouth at bedtime as needed.         . dorzolamide (TRUSOPT) 2 % ophthalmic solution   Both Eyes   Place 1 drop into both eyes 2 (two) times daily.         Marland Kitchen guaifenesin (ROBITUSSIN) 100 MG/5ML syrup   Oral   Take 100 mg by mouth 4 (four) times daily as needed for cough or congestion.         Marland Kitchen loperamide (IMODIUM A-D) 2 MG tablet   Oral   Take 4 mg by mouth 4 (four) times daily as  needed for diarrhea or loose stools.         Marland Kitchen loratadine (CLARITIN) 10 MG tablet   Oral   Take 10 mg by mouth daily.         . memantine (NAMENDA) 10 MG tablet   Oral   Take 10 mg by mouth 2 (two) times daily.         . pilocarpine (PILOCAR) 2 % ophthalmic solution   Both Eyes   Place 1 drop into both eyes 4 (four) times daily.         . prednisoLONE acetate (PRED FORTE) 1 % ophthalmic suspension   Both Eyes   Place 1 drop into both eyes as needed (as needed).           BP 176/91  Pulse 91  Temp(Src) 99.7 F (37.6 C) (Oral)  Resp 18  SpO2 99%  Physical Exam  Nursing note and vitals reviewed. Constitutional: She is oriented to person, place, and time. She appears well-developed and well-nourished. No distress.  HENT:  Head: Normocephalic and atraumatic.  Mouth/Throat: Oropharynx is clear and moist.  There is some bilateral maxillary and frontal sinus  tenderness. Nasal mucosa is edematous with slight rhinorrhea bilaterally. There is mucous discharge of the left nostril.  Eyes: Conjunctivae and EOM are normal. Pupils are equal, round, and reactive to light.  Arcus senilis present. No nystagmus.  Neck: Normal range of motion. Neck supple.  No meningeal signs  Cardiovascular: Normal rate and regular rhythm.   No murmur heard. Pulmonary/Chest: Effort normal and breath sounds normal. No respiratory distress. She has no wheezes. She has no rales.  Abdominal: Soft. There is no tenderness. There is no rigidity, no rebound, no guarding, no CVA tenderness and no tenderness at McBurney's point.  Musculoskeletal: Normal range of motion. She exhibits no edema and no tenderness.  Neurological: She is alert and oriented to person, place, and time. She has normal strength. No cranial nerve deficit or sensory deficit. Gait normal.  Skin: Skin is warm and dry. No rash noted.  Psychiatric: She has a normal mood and affect. Her behavior is normal.    ED Course  Procedures  Ct Head Wo Contrast  07/06/2012  *RADIOLOGY REPORT*  Clinical Data: Frontal headache.  CT HEAD WITHOUT CONTRAST  Technique:  Contiguous axial images were obtained from the base of the skull through the vertex without contrast.  Comparison: CT head 03/04/2007.  MRI brain 03/04/2007.  Findings: Diffuse cerebral atrophy.  Mild ventricular dilatation consistent with central atrophy.  Low attenuation changes in the deep white matter consistent with small vessel ischemia.  No mass effect or midline shift.  No abnormal extra-axial fluid collections.  Gray-white matter junctions are distinct.  Basal cisterns are not effaced.  No evidence of acute intracranial hemorrhage.  No depressed skull fractures.  Inflammatory mucosal thickening in the paranasal sinuses.  No acute air-fluid levels. No significant change since previous study.  IMPRESSION: No acute intracranial abnormalities.  Chronic-appearing  atrophy and small vessel ischemic changes.   Original Report Authenticated By: Burman Nieves, M.D.      1. Sinus headache       MDM  Patient seen and evaluated. Patient sent from [facility] for continued headache past several days. Patient appears well in no acute distress. Normal nonfocal neuro exam.  Patient with recent symptoms of congestion and rhinorrhea. Exam findings are consistent for this with suspicion for sinus pressure and congestion.   Pt feeling better and sleeping after medications.  Pt seen and discussed with Attending Physician. Agrees with plan.       Angus Seller, PA-C 07/06/12 [phone]

## 2012-07-06 NOTE — ED Provider Notes (Signed)
Medical screening examination/treatment/procedure(s) were conducted as a shared visit with non-physician practitioner(s) and myself.  I personally evaluated the patient during the encounter.  Pt with ongoing headache, some URI sxs, CT scan and exam benign.  Will d/c with decongestants, nasal steroids  Olivia Mackie, MD 07/06/12 [phone]

## 2012-07-06 NOTE — ED Notes (Signed)
[facility] called for transport.  

## 2012-07-06 NOTE — ED Notes (Signed)
Report called to Kinnie Scales, [place]

## 2012-07-07 ENCOUNTER — Encounter (HOSPITAL_COMMUNITY): Payer: Self-pay | Admitting: Vascular Surgery

## 2012-07-07 ENCOUNTER — Emergency Department (HOSPITAL_COMMUNITY): Payer: Medicare Other

## 2012-07-07 ENCOUNTER — Observation Stay (HOSPITAL_COMMUNITY)
Admission: EM | Admit: 2012-07-07 | Discharge: 2012-07-08 | Disposition: A | Discharge status: Home / Self Care | Payer: Medicare Other | Attending: Family Medicine | Admitting: Family Medicine

## 2012-07-07 DIAGNOSIS — I959 Hypotension, unspecified: Secondary | ICD-10-CM | POA: Diagnosis present

## 2012-07-07 DIAGNOSIS — R61 Generalized hyperhidrosis: Secondary | ICD-10-CM | POA: Insufficient documentation

## 2012-07-07 DIAGNOSIS — R42 Dizziness and giddiness: Secondary | ICD-10-CM | POA: Insufficient documentation

## 2012-07-07 DIAGNOSIS — I379 Nonrheumatic pulmonary valve disorder, unspecified: Secondary | ICD-10-CM | POA: Insufficient documentation

## 2012-07-07 DIAGNOSIS — R55 Syncope and collapse: Secondary | ICD-10-CM | POA: Diagnosis present

## 2012-07-07 DIAGNOSIS — R112 Nausea with vomiting, unspecified: Secondary | ICD-10-CM | POA: Diagnosis present

## 2012-07-07 DIAGNOSIS — I771 Stricture of artery: Secondary | ICD-10-CM | POA: Insufficient documentation

## 2012-07-07 DIAGNOSIS — I080 Rheumatic disorders of both mitral and aortic valves: Secondary | ICD-10-CM | POA: Insufficient documentation

## 2012-07-07 DIAGNOSIS — I4510 Unspecified right bundle-branch block: Secondary | ICD-10-CM | POA: Insufficient documentation

## 2012-07-07 DIAGNOSIS — R079 Chest pain, unspecified: Principal | ICD-10-CM | POA: Diagnosis present

## 2012-07-07 DIAGNOSIS — I079 Rheumatic tricuspid valve disease, unspecified: Secondary | ICD-10-CM | POA: Insufficient documentation

## 2012-07-07 DIAGNOSIS — R5381 Other malaise: Secondary | ICD-10-CM | POA: Insufficient documentation

## 2012-07-07 DIAGNOSIS — F0390 Unspecified dementia without behavioral disturbance: Secondary | ICD-10-CM | POA: Diagnosis present

## 2012-07-07 LAB — CBC WITH DIFFERENTIAL/PLATELET
Basophils Absolute: 0 10*3/uL (ref 0.0–0.1)
Basophils Relative: 1 % (ref 0–1)
Eosinophils Absolute: 0.2 10*3/uL (ref 0.0–0.7)
MCH: 30 pg (ref 26.0–34.0)
MCHC: 33.5 g/dL (ref 30.0–36.0)
Monocytes Relative: 10 % (ref 3–12)
Neutro Abs: 4.1 10*3/uL (ref 1.7–7.7)
Neutrophils Relative %: 62 % (ref 43–77)
Platelets: 198 10*3/uL (ref 150–400)
RDW: 15.1 % (ref 11.5–15.5)

## 2012-07-07 LAB — CK TOTAL AND CKMB (NOT AT ARMC)
Relative Index: 2.4 (ref 0.0–2.5)
Total CK: 100 U/L (ref 7–177)

## 2012-07-07 LAB — CREATININE, SERUM
GFR calc Af Amer: 59 mL/min — ABNORMAL LOW (ref >90)
GFR calc non Af Amer: 51 mL/min — ABNORMAL LOW (ref >90)

## 2012-07-07 LAB — POCT I-STAT TROPONIN I: Troponin i, poc: 0.01 ng/mL (ref 0.00–0.08)

## 2012-07-07 LAB — COMPREHENSIVE METABOLIC PANEL
AST: 18 U/L (ref 0–37)
Albumin: 3.4 g/dL — ABNORMAL LOW (ref 3.5–5.2)
Alkaline Phosphatase: 102 U/L (ref 39–117)
BUN: 21 mg/dL (ref 6–23)
Potassium: 3.9 mEq/L (ref 3.5–5.1)
Sodium: 142 mEq/L (ref 135–145)
Total Protein: 6.9 g/dL (ref 6.0–8.3)

## 2012-07-07 LAB — URINALYSIS, ROUTINE W REFLEX MICROSCOPIC
Glucose, UA: NEGATIVE mg/dL
Hgb urine dipstick: NEGATIVE
Ketones, ur: NEGATIVE mg/dL
pH: 5 (ref 5.0–8.0)

## 2012-07-07 LAB — URINE MICROSCOPIC-ADD ON

## 2012-07-07 LAB — CBC
MCHC: 32.7 g/dL (ref 30.0–36.0)
Platelets: 209 10*3/uL (ref 150–400)
RDW: 15.2 % (ref 11.5–15.5)
WBC: 7.9 10*3/uL (ref 4.0–10.5)

## 2012-07-07 MED ORDER — BIMATOPROST 0.01 % OP SOLN
1.0000 [drp] | Freq: Every day | OPHTHALMIC | Status: DC
  Administered 2012-07-07: 1 [drp] via OPHTHALMIC
  Filled 2012-07-07: qty 2.5

## 2012-07-07 MED ORDER — SODIUM CHLORIDE 0.9 % IV SOLN: INTRAVENOUS | Status: DC

## 2012-07-07 MED ORDER — SODIUM CHLORIDE 0.9 % IJ SOLN: 3.0000 mL | Freq: Two times a day (BID) | INTRAMUSCULAR | Status: DC

## 2012-07-07 MED ORDER — SODIUM CHLORIDE 0.9 % IV SOLN
INTRAVENOUS | Status: DC
  Administered 2012-07-07: 18:00:00 via INTRAVENOUS

## 2012-07-07 MED ORDER — ASPIRIN 81 MG PO CHEW
81.0000 mg | CHEWABLE_TABLET | Freq: Every day | ORAL | Status: DC
  Administered 2012-07-08: 81 mg via ORAL
  Filled 2012-07-07: qty 1

## 2012-07-07 MED ORDER — HYDROMORPHONE HCL PF 1 MG/ML IJ SOLN: 1.0000 mg | INTRAMUSCULAR | Status: DC | PRN

## 2012-07-07 MED ORDER — GUAIFENESIN ER 600 MG PO TB12
600.0000 mg | ORAL_TABLET | Freq: Two times a day (BID) | ORAL | Status: DC
  Administered 2012-07-07 – 2012-07-08 (×2): 600 mg via ORAL
  Filled 2012-07-07 (×3): qty 1

## 2012-07-07 MED ORDER — ONDANSETRON HCL 4 MG/2ML IJ SOLN: 4.0000 mg | Freq: Four times a day (QID) | INTRAMUSCULAR | Status: DC | PRN

## 2012-07-07 MED ORDER — DONEPEZIL HCL 10 MG PO TABS
10.0000 mg | ORAL_TABLET | Freq: Every day | ORAL | Status: DC
  Administered 2012-07-07: 10 mg via ORAL
  Filled 2012-07-07 (×2): qty 1

## 2012-07-07 MED ORDER — ONDANSETRON HCL 4 MG PO TABS: 4.0000 mg | ORAL_TABLET | Freq: Four times a day (QID) | ORAL | Status: DC | PRN

## 2012-07-07 MED ORDER — GUAIFENESIN 100 MG/5ML PO SYRP
100.0000 mg | ORAL_SOLUTION | Freq: Four times a day (QID) | ORAL | Status: DC | PRN
  Administered 2012-07-08: 100 mg via ORAL
  Filled 2012-07-07 (×2): qty 118

## 2012-07-07 MED ORDER — PREDNISOLONE ACETATE 1 % OP SUSP
1.0000 [drp] | OPHTHALMIC | Status: DC | PRN
  Filled 2012-07-07: qty 1

## 2012-07-07 MED ORDER — ONDANSETRON HCL 4 MG/2ML IJ SOLN
4.0000 mg | Freq: Once | INTRAMUSCULAR | Status: AC
  Administered 2012-07-07: 4 mg via INTRAVENOUS
  Filled 2012-07-07: qty 2

## 2012-07-07 MED ORDER — ALUM & MAG HYDROXIDE-SIMETH 200-200-20 MG/5ML PO SUSP: 30.0000 mL | Freq: Four times a day (QID) | ORAL | Status: DC | PRN

## 2012-07-07 MED ORDER — MEMANTINE HCL 10 MG PO TABS
10.0000 mg | ORAL_TABLET | Freq: Every day | ORAL | Status: DC
  Administered 2012-07-08: 10 mg via ORAL
  Filled 2012-07-07 (×2): qty 1

## 2012-07-07 MED ORDER — ONDANSETRON HCL 4 MG/2ML IJ SOLN: 4.0000 mg | Freq: Three times a day (TID) | INTRAMUSCULAR | Status: AC | PRN

## 2012-07-07 MED ORDER — ACETAMINOPHEN 325 MG PO TABS
650.0000 mg | ORAL_TABLET | Freq: Four times a day (QID) | ORAL | Status: DC | PRN
  Administered 2012-07-08: 650 mg via ORAL
  Filled 2012-07-07: qty 2

## 2012-07-07 MED ORDER — HYDROCODONE-ACETAMINOPHEN 5-325 MG PO TABS: 1.0000 | ORAL_TABLET | ORAL | Status: DC | PRN

## 2012-07-07 MED ORDER — VITAMIN D3 25 MCG (1000 UNIT) PO TABS
2000.0000 [IU] | ORAL_TABLET | Freq: Every day | ORAL | Status: DC
  Administered 2012-07-08: 2000 [IU] via ORAL
  Filled 2012-07-07 (×2): qty 2

## 2012-07-07 MED ORDER — ENOXAPARIN SODIUM 40 MG/0.4ML ~~LOC~~ SOLN
40.0000 mg | SUBCUTANEOUS | Status: DC
  Administered 2012-07-07: 40 mg via SUBCUTANEOUS
  Filled 2012-07-07 (×2): qty 0.4

## 2012-07-07 MED ORDER — FLUTICASONE PROPIONATE 50 MCG/ACT NA SUSP
2.0000 | Freq: Every day | NASAL | Status: DC
  Administered 2012-07-08: 2 via NASAL
  Filled 2012-07-07: qty 16

## 2012-07-07 MED ORDER — ACETAMINOPHEN 650 MG RE SUPP: 650.0000 mg | Freq: Four times a day (QID) | RECTAL | Status: DC | PRN

## 2012-07-07 MED ORDER — LORATADINE 10 MG PO TABS
10.0000 mg | ORAL_TABLET | Freq: Every day | ORAL | Status: DC
  Administered 2012-07-08: 10 mg via ORAL
  Filled 2012-07-07 (×2): qty 1

## 2012-07-07 MED ORDER — DORZOLAMIDE HCL 2 % OP SOLN
1.0000 [drp] | Freq: Two times a day (BID) | OPHTHALMIC | Status: DC
  Administered 2012-07-07 – 2012-07-08 (×2): 1 [drp] via OPHTHALMIC
  Filled 2012-07-07: qty 10

## 2012-07-07 MED ORDER — PILOCARPINE HCL 2 % OP SOLN
1.0000 [drp] | Freq: Four times a day (QID) | OPHTHALMIC | Status: DC
  Administered 2012-07-07 – 2012-07-08 (×3): 1 [drp] via OPHTHALMIC
  Filled 2012-07-07: qty 15

## 2012-07-07 NOTE — ED Notes (Signed)
Pt reports to the ED for eval of N/V, dizziness, near syncope, and chest tightness. No coffee ground or gross blood in the emesis. Pt received 342 of ASA and 1 nitro and afterward had an episode where she became bradycardic in the 50s, hypotensive in the 70s systolic, and nearly passed out after nitro. Pt lethargic at this time but responsive to verbal stimuli. Pts v/s stable at this time. 12 lead showed incomplete RBBB. Pt reports some chest tightness. Pt oriented x 4. Pt also reports diaphoresis. Pt denies any SOB, diarrhea, fever, or chills. Pt was seen yesterday and dx with a sinus infection and resulting HA. Per EMS pt on no new medications.

## 2012-07-07 NOTE — ED Provider Notes (Signed)
Medical screening examination/treatment/procedure(s) were conducted as a shared visit with non-physician practitioner(s) and myself.  I personally evaluated the patient during the encounter  Pt with lightheadedness, N/V and then chest pressure.  Lungs clear, HR is regular, no murmurs.  NTG dropped pressure by EMS, but improved here after IVF's.  Pt no longer has CP.  Pt is a retired Char[employer] fundraiser, no prior h/o CAD.  Abn ECG with RBBB, but no acute ischemic changes, troponin is initially neg.  Will admit to [facility] for chest pain and near syncope.    Gavin Pound. Ladell Lea, MD 07/07/12 [phone]

## 2012-07-07 NOTE — H&P (Signed)
History and Physical       Hospital Admission Note Date: 07/07/2012  Patient name: Robin Gregory Medical record number: [id number] Date of birth: Oct 21, 1921 Age: 79 y.o. Gender: female PCP: Alva Garnet., MD    Chief Complaint:  Syncopal episode today the chest pain  HPI: Patient is 79 year old female with history of dementia, glaucoma who is currently resident of [facility] facility presented with a syncopal episode today which has been. History was obtained from the patient and her daughter present in the room. Patient who is a retired Engineer, civil (consulting) stated that she was walking from the hallway to the dining room for lunch when she felt dizzy, nausea and vomited twice. Then she felt chest tightness which she described as midsternal with no radiation. EMS was called and patient received aspirin and sublingual nitroglycerin which dropped her BP in 70s and she had syncopal/unresponsive episode. She denied any prior cardiac history. She denied any slurred speech, gait instability or any focal neurological symptoms.   Review of Systems:  Constitutional: Denies fever, chills, diaphoresis, appetite change and fatigue.  HEENT: Denies photophobia, eye pain, redness, hearing loss, ear pain, congestion, sore throat, rhinorrhea, sneezing, mouth sores, trouble swallowing, neck pain, neck stiffness and tinnitus.   Respiratory: Denies SOB, DOE, cough, chest tightness,  and wheezing.   Cardiovascular: Please see history of present illness  Gastrointestinal: Denies abdominal pain, diarrhea, constipation, blood in stool and abdominal distention. + episodes of vomiting, clear no hematemesis  Genitourinary: Denies dysuria, urgency, frequency, hematuria, flank pain and difficulty urinating.  Musculoskeletal: Denies myalgias, back pain, joint swelling, arthralgias and gait problem.  Skin: Denies pallor, rash and wound.  Neurological:  Deniesseizures,  weakness, numbness and headaches.  please see history of present illness  Hematological: Denies adenopathy. Easy bruising, personal or family bleeding history  Psychiatric/Behavioral: Denies suicidal ideation, mood changes, confusion, nervousness, sleep disturbance and agitation  Past Medical History: Past Medical History  Diagnosis Date  . Glaucoma    Past Surgical History  Procedure Laterality Date  . Abdominal hysterectomy      Medications: Prior to Admission medications   Medication Sig Start Date End Date Taking? Authorizing Provider  acetaminophen (TYLENOL) 325 MG tablet Take 650 mg by mouth every morning.    Yes Historical Provider, MD  acetaminophen (TYLENOL) 500 MG tablet Take 1 tablet (500 mg total) by mouth every 6 (six) hours as needed for pain. 07/06/12  Yes Phill Mutter Dammen, PA-C  aspirin 81 MG chewable tablet Chew 81 mg by mouth daily.   Yes Historical Provider, MD  bimatoprost (LUMIGAN) 0.01 % SOLN Place 1 drop into both eyes at bedtime.   Yes Historical Provider, MD  cholecalciferol (VITAMIN D) 1000 UNITS tablet Take 2,000 Units by mouth daily.   Yes Historical Provider, MD  donepezil (ARICEPT) 10 MG tablet Take 10 mg by mouth at bedtime.   Yes Historical Provider, MD  dorzolamide (TRUSOPT) 2 % ophthalmic solution Place 1 drop into both eyes 2 (two) times daily.   Yes Historical Provider, MD  fluticasone (FLONASE) 50 MCG/ACT nasal spray Place 2 sprays into the nose daily. 07/06/12  Yes Peter S Dammen, PA-C  guaiFENesin (MUCINEX) 600 MG 12 hr tablet Take 1 tablet (600 mg total) by mouth 2 (two) times daily. 07/06/12  Yes Peter S Dammen, PA-C  guaifenesin (ROBITUSSIN) 100 MG/5ML syrup Take 100 mg by mouth 4 (four) times daily as needed for cough or congestion.   Yes Historical Provider, MD  ibuprofen (ADVIL,MOTRIN) 400 MG tablet  Take 1 tablet (400 mg total) by mouth every 8 (eight) hours as needed for pain. 07/06/12  Yes Peter S Dammen, PA-C  loperamide (IMODIUM  A-D) 2 MG tablet Take 4 mg by mouth 4 (four) times daily as needed for diarrhea or loose stools.   Yes Historical Provider, MD  loratadine (CLARITIN) 10 MG tablet Take 10 mg by mouth daily.   Yes Historical Provider, MD  memantine (NAMENDA) 10 MG tablet Take 10 mg by mouth daily.    Yes Historical Provider, MD  pilocarpine (PILOCAR) 2 % ophthalmic solution Place 1 drop into both eyes 4 (four) times daily.   Yes Historical Provider, MD  prednisoLONE acetate (PRED FORTE) 1 % ophthalmic suspension Place 1 drop into both eyes as needed (as needed).   Yes Historical Provider, MD    Allergies:  No Known Allergies  Social History:  reports that she has never smoked. She has never used smokeless tobacco. She reports that she does not drink alcohol or use illicit drugs.She currently ambulates without any assistance, is resident of assisted-living facility   Family History: Patient states that her mother died of "weak heart " and her father died of some kind of cancer   Physical Exam: Blood pressure 129/66, pulse 66, temperature 98.6 F (37 C), temperature source Oral, resp. rate 18, SpO2 93.00%. General: Alert, awake, oriented x3, in no acute distress. HEENT: normocephalic, atraumatic, anicteric sclera, pink conjunctiva, pupils equal and reactive to light and accomodation, oropharynx clear Neck: supple, no masses or lymphadenopathy, no goiter, no bruits  Heart: Regular rate and rhythm, without murmurs, rubs or gallops. Lungs: Clear to auscultation bilaterally, no wheezing, rales or rhonchi. Abdomen: Soft, nontender, nondistended, positive bowel sounds, no masses. Extremities: No clubbing, cyanosis or edema with positive pedal pulses. Neuro: Grossly intact, no focal neurological deficits, strength 5/5 upper and lower extremities bilaterally Psych: alert and oriented x 3, normal mood and affect Skin: no rashes or lesions, warm and dry   LABS on Admission:  Basic Metabolic Panel:  Recent  Labs Lab 07/07/12 1305  NA 142  K 3.9  CL 107  CO2 26  GLUCOSE 121*  BUN 21  CREATININE 1.04  CALCIUM 9.0   Liver Function Tests:  Recent Labs Lab 07/07/12 1305  AST 18  ALT 13  ALKPHOS 102  BILITOT 0.4  PROT 6.9  ALBUMIN 3.4*   No results found for this basename: LIPASE, AMYLASE,  in the last 168 hours No results found for this basename: AMMONIA,  in the last 168 hours CBC:  Recent Labs Lab 07/07/12 1305  WBC 6.5  NEUTROABS 4.1  HGB 12.5  HCT 37.3  MCV 89.7  PLT 198   Cardiac Enzymes: No results found for this basename: CKTOTAL, CKMB, CKMBINDEX, TROPONINI,  in the last 168 hours BNP: No components found with this basename: POCBNP,  CBG: No results found for this basename: GLUCAP,  in the last 168 hours   Radiological Exams on Admission: Dg Chest 2 View  07/07/2012  *RADIOLOGY REPORT*  Clinical Data: Weakness, syncopal episode, chest pain  CHEST - 2 VIEW  Comparison: 03/04/2007; 01/15/2004; chest CT - 03/05/2007  Findings:  Examination is degraded secondary to patient body habitus and positioning.  Grossly unchanged cardiac silhouette and mediastinal contours with mild tortuosity of the thoracic aorta.  Lung volumes are reduced with associated increasing bibasilar opacities favored to represent atelectasis.  No focal airspace opacities.  No definite pleural effusion or pneumothorax.  Grossly unchanged bones.  IMPRESSION: No  definite acute cardiopulmonary disease on this slightly degraded examination.   Original Report Authenticated By: Tacey Ruiz, MD    Ct Head Wo Contrast  07/06/2012  *RADIOLOGY REPORT*  Clinical Data: Frontal headache.  CT HEAD WITHOUT CONTRAST  Technique:  Contiguous axial images were obtained from the base of the skull through the vertex without contrast.  Comparison: CT head 03/04/2007.  MRI brain 03/04/2007.  Findings: Diffuse cerebral atrophy.  Mild ventricular dilatation consistent with central atrophy.  Low attenuation changes in the deep  white matter consistent with small vessel ischemia.  No mass effect or midline shift.  No abnormal extra-axial fluid collections.  Gray-white matter junctions are distinct.  Basal cisterns are not effaced.  No evidence of acute intracranial hemorrhage.  No depressed skull fractures.  Inflammatory mucosal thickening in the paranasal sinuses.  No acute air-fluid levels. No significant change since previous study.  IMPRESSION: No acute intracranial abnormalities.  Chronic-appearing atrophy and small vessel ischemic changes.   Original Report Authenticated By: Burman Nieves, M.D.     EKG showed rate of 64, normal sinus rhythm, right bundle branch block and LAFB, no prior EKG to compare with.   Assessment/Plan Principal Problem:   Syncope with chest pain: - Admit for observation, cardiac telemetry, obtain serial cardiac enzymes. From her description it appears that the patient's syncopal episode happened after she was given sublingual nitroglycerin which caused hypotension. - Obtain 2-D echocardiogram and carotid Dopplers, continue aspirin. - Patient is currently chest pain-free. If she has any chest pain, EKG changes or positive cardiac enzymes, will consult with cardiology.  Active Problems:   Dementia: Continue Namenda    Hypotension: Continue gentle hydration BP currently stable    Nausea and vomiting - Continue when necessary Zofran, nausea and vomiting is resolved and patient is asking for solid food   DVT prophylaxis: Lovenox   CODE STATUS: Full code   Further plan will depend as patient's clinical course evolves and further radiologic and laboratory data become available.   Time Spent on Admission: 1 hour   RAI,RIPUDEEP M.D. [facility] 07/07/2012, 5:12 PM Pager: [phone]  If 7PM-7AM, please contact night-coverage [email or web address] Password [email or web address]

## 2012-07-07 NOTE — ED Provider Notes (Signed)
History     CSN: [id number]  Arrival date & time 07/07/12  1231   First MD Initiated Contact with Patient 07/07/12 1249      Chief Complaint  Patient presents with  . Near Syncope  . Chest Pain    (Consider location/radiation/quality/duration/timing/severity/associated sxs/prior treatment) HPI Robin Gregory is a 79 y.o. female who presents to ED via EMS from assisted living facility. Pt EMS pt was walking to the cafeteria when became dizzy, and began vomiting. Per ems, pt at first had no complaints, however later admitted that she still felt nauseated and had chest "pressure." Pt was given 342mg  aspirin by EMS and a SL nitrol which dropped her Blood pressure to 70s systolic and pt had an "unresponsive episode" at that time. Pt currently has no complaints other than chest tightness. States nausea resolved. Pt was seen few days ago for a headache. Stats no headache at this time. No focal neurologic complaints.   Past Medical History  Diagnosis Date  . Glaucoma     Past Surgical History  Procedure Laterality Date  . Abdominal hysterectomy      No family history on file.  History  Substance Use Topics  . Smoking status: Never Smoker   . Smokeless tobacco: Never Used  . Alcohol Use: No    OB History   Grav Para Term Preterm Abortions TAB SAB Ect Mult Living                  Review of Systems  Respiratory: Positive for chest tightness. Negative for cough and shortness of breath.   Cardiovascular: Positive for chest pain. Negative for palpitations and leg swelling.  Gastrointestinal: Positive for nausea and vomiting. Negative for abdominal pain.  Neurological: Positive for dizziness and light-headedness. Negative for headaches.  All other systems reviewed and are negative.    Allergies  Review of patient's allergies indicates no known allergies.  Home Medications   Current Outpatient Rx  Name  Route  Sig  Dispense  Refill  . acetaminophen (TYLENOL) 325 MG  tablet   Oral   Take 650 mg by mouth every 6 (six) hours as needed for pain (arthritis).         Marland Kitchen acetaminophen (TYLENOL) 500 MG tablet   Oral   Take 1 tablet (500 mg total) by mouth every 6 (six) hours as needed for pain.   30 tablet   0   . aspirin 81 MG chewable tablet   Oral   Chew 81 mg by mouth daily.         . bimatoprost (LUMIGAN) 0.01 % SOLN   Both Eyes   Place 1 drop into both eyes at bedtime.         . cholecalciferol (VITAMIN D) 1000 UNITS tablet   Oral   Take 2,000 Units by mouth daily.         Marland Kitchen donepezil (ARICEPT) 10 MG tablet   Oral   Take 10 mg by mouth at bedtime as needed.         . dorzolamide (TRUSOPT) 2 % ophthalmic solution   Both Eyes   Place 1 drop into both eyes 2 (two) times daily.         . fluticasone (FLONASE) 50 MCG/ACT nasal spray   Nasal   Place 2 sprays into the nose daily.   16 g   2   . guaiFENesin (MUCINEX) 600 MG 12 hr tablet   Oral   Take 1 tablet (600  mg total) by mouth 2 (two) times daily.   30 tablet   0   . guaifenesin (ROBITUSSIN) 100 MG/5ML syrup   Oral   Take 100 mg by mouth 4 (four) times daily as needed for cough or congestion.         Marland Kitchen ibuprofen (ADVIL,MOTRIN) 400 MG tablet   Oral   Take 1 tablet (400 mg total) by mouth every 8 (eight) hours as needed for pain.   30 tablet   0   . loperamide (IMODIUM A-D) 2 MG tablet   Oral   Take 4 mg by mouth 4 (four) times daily as needed for diarrhea or loose stools.         Marland Kitchen loratadine (CLARITIN) 10 MG tablet   Oral   Take 10 mg by mouth daily.         . memantine (NAMENDA) 10 MG tablet   Oral   Take 10 mg by mouth 2 (two) times daily.         . pilocarpine (PILOCAR) 2 % ophthalmic solution   Both Eyes   Place 1 drop into both eyes 4 (four) times daily.         . prednisoLONE acetate (PRED FORTE) 1 % ophthalmic suspension   Both Eyes   Place 1 drop into both eyes as needed (as needed).           BP 104/66  Pulse 59  Temp(Src)  98.6 F (37 C) (Oral)  Resp 18  SpO2 97%  Physical Exam  Nursing note and vitals reviewed. Constitutional: She is oriented to person, place, and time. She appears well-developed and well-nourished. No distress.  HENT:  Head: Normocephalic and atraumatic.  Eyes: Conjunctivae are normal. Pupils are equal, round, and reactive to light.  Neck: Neck supple.  Cardiovascular: Normal rate, regular rhythm and normal heart sounds.   Pulmonary/Chest: Effort normal and breath sounds normal. No respiratory distress. She has no wheezes. She has no rales.  Abdominal: Soft. Bowel sounds are normal. She exhibits no distension. There is no tenderness. There is no rebound.  Neurological: She is alert and oriented to person, place, and time.    ED Course  Procedures (including critical care time)   Date: 07/07/2012  Rate: 64  Rhythm: normal sinus rhythm  QRS Axis: left  Intervals: normal  ST/T Wave abnormalities: normal  Conduction Disutrbances:right bundle branch block and left anterior fascicular block  Narrative Interpretation:   Old EKG Reviewed: none available   Results for orders placed during the hospital encounter of 07/07/12  CBC WITH DIFFERENTIAL      Result Value Range   WBC 6.5  4.0 - 10.5 K/uL   RBC 4.16  3.87 - 5.11 MIL/uL   Hemoglobin 12.5  12.0 - 15.0 g/dL   HCT 42.5  95.6 - 38.7 %   MCV 89.7  78.0 - 100.0 fL   MCH 30.0  26.0 - 34.0 pg   MCHC 33.5  30.0 - 36.0 g/dL   RDW 56.4  33.2 - 95.1 %   Platelets 198  150 - 400 K/uL   Neutrophils Relative 62  43 - 77 %   Neutro Abs 4.1  1.7 - 7.7 K/uL   Lymphocytes Relative 24  12 - 46 %   Lymphs Abs 1.5  0.7 - 4.0 K/uL   Monocytes Relative 10  3 - 12 %   Monocytes Absolute 0.7  0.1 - 1.0 K/uL   Eosinophils Relative 3  0 -  5 %   Eosinophils Absolute 0.2  0.0 - 0.7 K/uL   Basophils Relative 1  0 - 1 %   Basophils Absolute 0.0  0.0 - 0.1 K/uL  COMPREHENSIVE METABOLIC PANEL      Result Value Range   Sodium 142  135 - 145 mEq/L    Potassium 3.9  3.5 - 5.1 mEq/L   Chloride 107  96 - 112 mEq/L   CO2 26  19 - 32 mEq/L   Glucose, Bld 121 (*) 70 - 99 mg/dL   BUN 21  6 - 23 mg/dL   Creatinine, Ser 5.62  0.50 - 1.10 mg/dL   Calcium 9.0  8.4 - 13.0 mg/dL   Total Protein 6.9  6.0 - 8.3 g/dL   Albumin 3.4 (*) 3.5 - 5.2 g/dL   AST 18  0 - 37 U/L   ALT 13  0 - 35 U/L   Alkaline Phosphatase 102  39 - 117 U/L   Total Bilirubin 0.4  0.3 - 1.2 mg/dL   GFR calc non Af Amer 47 (*) >90 mL/min   GFR calc Af Amer 54 (*) >90 mL/min  URINALYSIS, ROUTINE W REFLEX MICROSCOPIC      Result Value Range   Color, Urine YELLOW  YELLOW   APPearance CLEAR  CLEAR   Specific Gravity, Urine 1.023  1.005 - 1.030   pH 5.0  5.0 - 8.0   Glucose, UA NEGATIVE  NEGATIVE mg/dL   Hgb urine dipstick NEGATIVE  NEGATIVE   Bilirubin Urine NEGATIVE  NEGATIVE   Ketones, ur NEGATIVE  NEGATIVE mg/dL   Protein, ur NEGATIVE  NEGATIVE mg/dL   Urobilinogen, UA 1.0  0.0 - 1.0 mg/dL   Nitrite NEGATIVE  NEGATIVE   Leukocytes, UA SMALL (*) NEGATIVE  URINE MICROSCOPIC-ADD ON      Result Value Range   Squamous Epithelial / LPF FEW (*) RARE   WBC, UA 3-6  <3 WBC/hpf   RBC / HPF 0-2  <3 RBC/hpf   Bacteria, UA FEW (*) RARE   Casts HYALINE CASTS (*) NEGATIVE  POCT I-STAT TROPONIN I      Result Value Range   Troponin i, poc 0.01  0.00 - 0.08 ng/mL   Comment 3            Dg Chest 2 View  07/07/2012  *RADIOLOGY REPORT*  Clinical Data: Weakness, syncopal episode, chest pain  CHEST - 2 VIEW  Comparison: 03/04/2007; 01/15/2004; chest CT - 03/05/2007  Findings:  Examination is degraded secondary to patient body habitus and positioning.  Grossly unchanged cardiac silhouette and mediastinal contours with mild tortuosity of the thoracic aorta.  Lung volumes are reduced with associated increasing bibasilar opacities favored to represent atelectasis.  No focal airspace opacities.  No definite pleural effusion or pneumothorax.  Grossly unchanged bones.  IMPRESSION: No definite  acute cardiopulmonary disease on this slightly degraded examination.   Original Report Authenticated By: Tacey Ruiz, MD    Ct Head Wo Contrast  07/06/2012  *RADIOLOGY REPORT*  Clinical Data: Frontal headache.  CT HEAD WITHOUT CONTRAST  Technique:  Contiguous axial images were obtained from the base of the skull through the vertex without contrast.  Comparison: CT head 03/04/2007.  MRI brain 03/04/2007.  Findings: Diffuse cerebral atrophy.  Mild ventricular dilatation consistent with central atrophy.  Low attenuation changes in the deep white matter consistent with small vessel ischemia.  No mass effect or midline shift.  No abnormal extra-axial fluid collections.  Gray-white matter junctions  are distinct.  Basal cisterns are not effaced.  No evidence of acute intracranial hemorrhage.  No depressed skull fractures.  Inflammatory mucosal thickening in the paranasal sinuses.  No acute air-fluid levels. No significant change since previous study.  IMPRESSION: No acute intracranial abnormalities.  Chronic-appearing atrophy and small vessel ischemic changes.   Original Report Authenticated By: Burman Nieves, M.D.       1. Chest pain   2. Near syncope       MDM  Pt with near syncopal episode followed by CP and NV. Syncopal episode after receiving nitro BY EMS. At thtat time. BP 70s systolic. Here, BP improved. Pt now symptom free. Work up negative so far including negative troponin. Pt has no medical problems, no cardiac history. VS normal. Pt will be admitted to obs for rule out. Discussed with [facility], will admit.  Filed Vitals:   07/07/12 1600 07/07/12 1615 07/07/12 1630 07/07/12 1645  BP: 106/58 108/52 124/68 129/66  Pulse: 53 51 54 66  Temp:      TempSrc:      Resp: 18 23 17 18   SpO2: 99% 99% 98% 93%           Cyntia Staley A Jarick Harkins, PA-C 07/07/12 1703

## 2012-07-08 DIAGNOSIS — R55 Syncope and collapse: Secondary | ICD-10-CM

## 2012-07-08 DIAGNOSIS — I359 Nonrheumatic aortic valve disorder, unspecified: Secondary | ICD-10-CM

## 2012-07-08 LAB — BASIC METABOLIC PANEL WITH GFR
BUN: 17 mg/dL (ref 6–23)
CO2: 23 meq/L (ref 19–32)
Calcium: 8.4 mg/dL (ref 8.4–10.5)
Chloride: 109 meq/L (ref 96–112)
Creatinine, Ser: 0.92 mg/dL (ref 0.50–1.10)
GFR calc Af Amer: 63 mL/min — ABNORMAL LOW
GFR calc non Af Amer: 54 mL/min — ABNORMAL LOW
Glucose, Bld: 86 mg/dL (ref 70–99)
Potassium: 3.8 meq/L (ref 3.5–5.1)
Sodium: 140 meq/L (ref 135–145)

## 2012-07-08 LAB — TROPONIN I
Troponin I: <0.3 ng/mL
Troponin I: <0.3 ng/mL (ref <0.30)

## 2012-07-08 LAB — CK TOTAL AND CKMB (NOT AT ARMC)
Total CK: 102 U/L (ref 7–177)
Total CK: 107 U/L (ref 7–177)

## 2012-07-08 LAB — CBC
MCH: 29.6 pg (ref 26.0–34.0)
MCHC: 32.9 g/dL (ref 30.0–36.0)
Platelets: 190 10*3/uL (ref 150–400)

## 2012-07-08 LAB — GLUCOSE, CAPILLARY: Glucose-Capillary: 104 mg/dL — ABNORMAL HIGH (ref 70–99)

## 2012-07-08 NOTE — Progress Notes (Signed)
Clinical Social Work Department BRIEF PSYCHOSOCIAL ASSESSMENT 07/08/2012  Patient:  Robin Gregory, Robin Gregory     Account Number:  [id number]     Admit date:  07/07/2012  Clinical Social Worker:  Kirke Shaggy  Date/Time:  07/08/2012 02:12 PM  Referred by:  Physician  Date Referred:  07/08/2012 Referred for  ALF Placement   Other Referral:   Interview type:  Family Other interview type:    PSYCHOSOCIAL DATA Living Status:  FACILITY Admitted from facility:  [facility] Level of care:  Assisted Living Primary support name:  Steward Drone Lumpkins Primary support relationship to patient:  FAMILY Degree of support available:   good    CURRENT CONCERNS Current Concerns  Post-Acute Placement   Other Concerns:    SOCIAL WORK ASSESSMENT / PLAN CSW was referred this pt this morning to complete transfer back to ALF. Pt will go to [facility] via Ambulance.   Assessment/plan status:   Other assessment/ plan:   Information/referral to community resources:    PATIENT'S/FAMILY'S RESPONSE TO PLAN OF CARE: Pt and family are in agreement for ALF return today.   Sherald Barge, LCSW-A Clinical Social Worker [phone]

## 2012-07-08 NOTE — Discharge Summary (Signed)
Physician Discharge Summary  RYLEEANN URQUIZA HQI:[id number] DOB: March 14, 1922 DOA: 07/07/2012  PCP: Alva Garnet., MD  Patient re seen and evaluated again given that results of tests were available later on during the day  Admit date: 07/07/2012 Discharge date: 07/08/2012  Time spent: > 35 minutes  Recommendations for Outpatient Follow-up:  1. Please be sure to follow up with your pcp in 1-2 weeks  Discharge Diagnoses:  Principal Problem:   Syncope Active Problems:   Dementia   Chest pain   Hypotension   Nausea and vomiting   Discharge Condition: stable  Diet recommendation: Heart healthy diet.  Filed Weights   07/07/12 1722  Weight: 71.215 kg (157 lb)    History of present illness:  Please refer to HPI for further details  Patient is an 79 y/o with h/o dementia who presented to the hospital for evaluation for chest pain and syncopal episode which reportedly occurred after patient received nitroglycerin  Hospital Course:  1. Syncope and chest pain - From history sounds like it was related to nitroglycerin administration  - Work up negative. Echocardiogram resulted with no mention of wall motion abnormalities and normal EF of 55-60% - prelim carotid doppler report: reported as no evidence of hemodynamically significant internal carotid artery stenosis.  - Chest pain has resolved. Cardiac enzymes negative x 3  - Orthostatic vitals negative    2. Dementia  - stable on namenda   3. Hypotension  - resolved with improved oral intake and initial IVF's  - Most likely also contributed to # 1 I suspect. Resolved on day of d/c   4. Nausea and vomiting  - As needed antiemetics  - Resolved during lunch time.  Patient tolerating her meal well and reports no nausea or emesis.  - Etiology uncertain at this juncture. No fevers and WBC within normal limits.    Procedures:  Echocardiogram  Consultations:  none  Discharge Exam: Filed Vitals:   07/08/12 1040 07/08/12  1041 07/08/12 1042 07/08/12 1045  BP: 124/66 111/64 132/81 150/96  Pulse: 59 64 73 68  Temp:      TempSrc:      Resp:      Height:      Weight:      SpO2:        General: Pt in NAD, sitting up smiling in chair Cardiovascular: RRR, No MRG Respiratory: CTA BL, no wheezes  Discharge Instructions  Discharge Orders   Future Orders Complete By Expires     Call MD for:  extreme fatigue  As directed     Call MD for:  persistant dizziness or light-headedness  As directed     Call MD for:  temperature >100.4  As directed     Diet - low sodium heart healthy  As directed     Discharge instructions  As directed     Comments:      Please be sure to follow up with your primary care physician in 1-2 weeks or sooner should any new concerns arise.    Increase activity slowly  As directed         Medication List    TAKE these medications       acetaminophen 325 MG tablet  Commonly known as:  TYLENOL  Take 650 mg by mouth every morning.     acetaminophen 500 MG tablet  Commonly known as:  TYLENOL  Take 1 tablet (500 mg total) by mouth every 6 (six) hours as needed for pain.  aspirin 81 MG chewable tablet  Chew 81 mg by mouth daily.     bimatoprost 0.01 % Soln  Commonly known as:  LUMIGAN  Place 1 drop into both eyes at bedtime.     cholecalciferol 1000 UNITS tablet  Commonly known as:  VITAMIN D  Take 2,000 Units by mouth daily.     donepezil 10 MG tablet  Commonly known as:  ARICEPT  Take 10 mg by mouth at bedtime.     dorzolamide 2 % ophthalmic solution  Commonly known as:  TRUSOPT  Place 1 drop into both eyes 2 (two) times daily.     fluticasone 50 MCG/ACT nasal spray  Commonly known as:  FLONASE  Place 2 sprays into the nose daily.     guaifenesin 100 MG/5ML syrup  Commonly known as:  ROBITUSSIN  Take 100 mg by mouth 4 (four) times daily as needed for cough or congestion.     guaiFENesin 600 MG 12 hr tablet  Commonly known as:  MUCINEX  Take 1 tablet (600 mg  total) by mouth 2 (two) times daily.     ibuprofen 400 MG tablet  Commonly known as:  ADVIL,MOTRIN  Take 1 tablet (400 mg total) by mouth every 8 (eight) hours as needed for pain.     loperamide 2 MG tablet  Commonly known as:  IMODIUM A-D  Take 4 mg by mouth 4 (four) times daily as needed for diarrhea or loose stools.     loratadine 10 MG tablet  Commonly known as:  CLARITIN  Take 10 mg by mouth daily.     memantine 10 MG tablet  Commonly known as:  NAMENDA  Take 10 mg by mouth daily.     pilocarpine 2 % ophthalmic solution  Commonly known as:  PILOCAR  Place 1 drop into both eyes 4 (four) times daily.     prednisoLONE acetate 1 % ophthalmic suspension  Commonly known as:  PRED FORTE  Place 1 drop into both eyes as needed (as needed).          The results of significant diagnostics from this hospitalization (including imaging, microbiology, ancillary and laboratory) are listed below for reference.    Significant Diagnostic Studies: Dg Chest 2 View  07/07/2012  *RADIOLOGY REPORT*  Clinical Data: Weakness, syncopal episode, chest pain  CHEST - 2 VIEW  Comparison: 03/04/2007; 01/15/2004; chest CT - 03/05/2007  Findings:  Examination is degraded secondary to patient body habitus and positioning.  Grossly unchanged cardiac silhouette and mediastinal contours with mild tortuosity of the thoracic aorta.  Lung volumes are reduced with associated increasing bibasilar opacities favored to represent atelectasis.  No focal airspace opacities.  No definite pleural effusion or pneumothorax.  Grossly unchanged bones.  IMPRESSION: No definite acute cardiopulmonary disease on this slightly degraded examination.   Original Report Authenticated By: Tacey Ruiz, MD    Ct Head Wo Contrast  07/06/2012  *RADIOLOGY REPORT*  Clinical Data: Frontal headache.  CT HEAD WITHOUT CONTRAST  Technique:  Contiguous axial images were obtained from the base of the skull through the vertex without contrast.   Comparison: CT head 03/04/2007.  MRI brain 03/04/2007.  Findings: Diffuse cerebral atrophy.  Mild ventricular dilatation consistent with central atrophy.  Low attenuation changes in the deep white matter consistent with small vessel ischemia.  No mass effect or midline shift.  No abnormal extra-axial fluid collections.  Gray-white matter junctions are distinct.  Basal cisterns are not effaced.  No evidence of acute intracranial  hemorrhage.  No depressed skull fractures.  Inflammatory mucosal thickening in the paranasal sinuses.  No acute air-fluid levels. No significant change since previous study.  IMPRESSION: No acute intracranial abnormalities.  Chronic-appearing atrophy and small vessel ischemic changes.   Original Report Authenticated By: Burman Nieves, M.D.     Microbiology: Recent Results (from the past 240 hour(s))  URINE CULTURE     Status: None   Collection Time    07/07/12  3:08 PM      Result Value Range Status   Specimen Description URINE, RANDOM   Final   Special Requests NONE   Final   Culture  Setup Time 07/07/2012 16:02   Final   Colony Count >=100,000 COLONIES/ML   Final   Culture GRAM NEGATIVE RODS   Final   Report Status PENDING   Incomplete     Labs: Basic Metabolic Panel:  Recent Labs Lab 07/07/12 1305 07/07/12 1735 07/08/12 0434  NA 142  --  140  K 3.9  --  3.8  CL 107  --  109  CO2 26  --  23  GLUCOSE 121*  --  86  BUN 21  --  17  CREATININE 1.04 0.97 0.92  CALCIUM 9.0  --  8.4   Liver Function Tests:  Recent Labs Lab 07/07/12 1305  AST 18  ALT 13  ALKPHOS 102  BILITOT 0.4  PROT 6.9  ALBUMIN 3.4*   No results found for this basename: LIPASE, AMYLASE,  in the last 168 hours No results found for this basename: AMMONIA,  in the last 168 hours CBC:  Recent Labs Lab 07/07/12 1305 07/07/12 1735 07/08/12 0434  WBC 6.5 7.9 5.9  NEUTROABS 4.1  --   --   HGB 12.5 11.8* 10.7*  HCT 37.3 36.1 32.5*  MCV 89.7 89.6 89.8  PLT 198 209 190    Cardiac Enzymes:  Recent Labs Lab 07/07/12 1735 07/07/12 2340 07/08/12 0434  CKTOTAL 100 102 107  CKMB 2.4 2.2 2.1  TROPONINI <0.30 <0.30 <0.30   BNP: BNP (last 3 results) No results found for this basename: PROBNP,  in the last 8760 hours CBG:  Recent Labs Lab 07/07/12 1733 07/07/12 2056  GLUCAP 108* 104*       Signed:  Penny Pia  [facility] 07/08/2012, 1:26 PM

## 2012-07-08 NOTE — Progress Notes (Signed)
*  PRELIMINARY RESULTS* Vascular Ultrasound Carotid Duplex (Doppler) has been completed.  Preliminary findings: Bilateral:  No evidence of hemodynamically significant internal carotid artery stenosis.   Vertebral artery flow is antegrade.     Farrel Demark, RDMS, RVT  07/08/2012, 9:35 AM

## 2012-07-08 NOTE — Progress Notes (Signed)
[facility] PROGRESS NOTE  Robin Gregory GNF:[id number] DOB: 07-Sep-1921 DOA: 07/07/2012 PCP: Alva Garnet., MD  Assessment/Plan: 1. Syncope and chest pain - From history sounds like it was related to nitroglycerin administration - At this juncture will await for full initial work up to be completed prior to making further recommendations regarding disposition - prelim carotid doppler report: reported as no evidence of hemodynamically significant internal carotid artery stenosis. - Chest pain has resolved. Cardiac enzymes negative x 3 - Orthostatic vitals negative - Echocardiogram pending.  2. Dementia - stable on namenda  3. Hypotension - resolved with improved oral intake and initial IVF's - Most likely also contributed to # 1 I suspect  4. Nausea and vomiting - As needed antiemetics - Ate some of her breakfast but had to stop because she was starting to feel nausea.  - Etiology uncertain at this juncture.  No fevers and WBC within normal limits.   Code Status: full Family Communication: Discussed with daughter. Disposition Plan: Pending work up but most likely d/c home in 1-2 days.   Consultants:  none  Procedures:  none  Antibiotics:  None  HPI/Subjective: No new complaints.  Chest pain has resolved. No new dizziness reported.  No acute issues overnight.  Objective: Filed Vitals:   07/08/12 1040 07/08/12 1041 07/08/12 1042 07/08/12 1045  BP: 124/66 111/64 132/81 150/96  Pulse: 59 64 73 68  Temp:      TempSrc:      Resp:      Height:      Weight:      SpO2:        Intake/Output Summary (Last 24 hours) at 07/08/12 1052 Last data filed at 07/08/12 0900  Gross per 24 hour  Intake    120 ml  Output      0 ml  Net    120 ml   Filed Weights   07/07/12 1722  Weight: 71.215 kg (157 lb)    Exam:   General:  Pt in NAD, Alert and Awake  Cardiovascular: RRR, No MRG Respiratory: CTA BL, no wheezes  Abdomen: soft, NT,  ND  Musculoskeletal: no cyanosis or clubbing   Data Reviewed: Basic Metabolic Panel:  Recent Labs Lab 07/07/12 1305 07/07/12 1735 07/08/12 0434  NA 142  --  140  K 3.9  --  3.8  CL 107  --  109  CO2 26  --  23  GLUCOSE 121*  --  86  BUN 21  --  17  CREATININE 1.04 0.97 0.92  CALCIUM 9.0  --  8.4   Liver Function Tests:  Recent Labs Lab 07/07/12 1305  AST 18  ALT 13  ALKPHOS 102  BILITOT 0.4  PROT 6.9  ALBUMIN 3.4*   No results found for this basename: LIPASE, AMYLASE,  in the last 168 hours No results found for this basename: AMMONIA,  in the last 168 hours CBC:  Recent Labs Lab 07/07/12 1305 07/07/12 1735 07/08/12 0434  WBC 6.5 7.9 5.9  NEUTROABS 4.1  --   --   HGB 12.5 11.8* 10.7*  HCT 37.3 36.1 32.5*  MCV 89.7 89.6 89.8  PLT 198 209 190   Cardiac Enzymes:  Recent Labs Lab 07/07/12 1735 07/07/12 2340 07/08/12 0434  CKTOTAL 100 102 107  CKMB 2.4 2.2 2.1  TROPONINI <0.30 <0.30 <0.30   BNP (last 3 results) No results found for this basename: PROBNP,  in the last 8760 hours CBG:  Recent Labs Lab 07/07/12 1733  07/07/12 2056  GLUCAP 108* 104*    No results found for this or any previous visit (from the past 240 hour(s)).   Studies: Dg Chest 2 View  07/07/2012  *RADIOLOGY REPORT*  Clinical Data: Weakness, syncopal episode, chest pain  CHEST - 2 VIEW  Comparison: 03/04/2007; 01/15/2004; chest CT - 03/05/2007  Findings:  Examination is degraded secondary to patient body habitus and positioning.  Grossly unchanged cardiac silhouette and mediastinal contours with mild tortuosity of the thoracic aorta.  Lung volumes are reduced with associated increasing bibasilar opacities favored to represent atelectasis.  No focal airspace opacities.  No definite pleural effusion or pneumothorax.  Grossly unchanged bones.  IMPRESSION: No definite acute cardiopulmonary disease on this slightly degraded examination.   Original Report Authenticated By: Tacey Ruiz, MD      Scheduled Meds: . sodium chloride   Intravenous STAT  . aspirin  81 mg Oral Daily  . bimatoprost  1 drop Both Eyes QHS  . cholecalciferol  2,000 Units Oral Daily  . donepezil  10 mg Oral QHS  . dorzolamide  1 drop Both Eyes BID  . enoxaparin (LOVENOX) injection  40 mg Subcutaneous Q24H  . fluticasone  2 spray Each Nare Daily  . guaiFENesin  600 mg Oral BID  . loratadine  10 mg Oral Daily  . memantine  10 mg Oral Daily  . pilocarpine  1 drop Both Eyes QID  . sodium chloride  3 mL Intravenous Q12H   Continuous Infusions:   Principal Problem:   Syncope Active Problems:   Dementia   Chest pain   Hypotension   Nausea and vomiting    Time spent: > 35 minutes    Penny Pia  [facility] Pager [phone]. If 7PM-7AM, please contact night-coverage at [email or web address], password [facility] 07/08/2012, 10:52 AM  LOS: 1 day

## 2012-07-09 LAB — URINE CULTURE: Colony Count: >=100000

## 2012-10-07 ENCOUNTER — Encounter (HOSPITAL_COMMUNITY): Payer: Self-pay | Admitting: Emergency Medicine

## 2012-10-07 ENCOUNTER — Inpatient Hospital Stay (HOSPITAL_COMMUNITY)
Admission: EM | Admit: 2012-10-07 | Discharge: 2012-10-11 | DRG: 378 | Disposition: A | Discharge status: Nursing Home (Includes ICF) | Payer: Medicare Other | Attending: Internal Medicine | Admitting: Internal Medicine

## 2012-10-07 DIAGNOSIS — D126 Benign neoplasm of colon, unspecified: Secondary | ICD-10-CM | POA: Diagnosis present

## 2012-10-07 DIAGNOSIS — E785 Hyperlipidemia, unspecified: Secondary | ICD-10-CM | POA: Diagnosis present

## 2012-10-07 DIAGNOSIS — R7309 Other abnormal glucose: Secondary | ICD-10-CM | POA: Diagnosis present

## 2012-10-07 DIAGNOSIS — J309 Allergic rhinitis, unspecified: Secondary | ICD-10-CM | POA: Diagnosis present

## 2012-10-07 DIAGNOSIS — D62 Acute posthemorrhagic anemia: Secondary | ICD-10-CM | POA: Diagnosis present

## 2012-10-07 DIAGNOSIS — F0390 Unspecified dementia without behavioral disturbance: Secondary | ICD-10-CM | POA: Diagnosis present

## 2012-10-07 DIAGNOSIS — I10 Essential (primary) hypertension: Secondary | ICD-10-CM | POA: Diagnosis present

## 2012-10-07 DIAGNOSIS — F0280 Dementia in other diseases classified elsewhere without behavioral disturbance: Secondary | ICD-10-CM | POA: Diagnosis present

## 2012-10-07 DIAGNOSIS — K5731 Diverticulosis of large intestine without perforation or abscess with bleeding: Principal | ICD-10-CM

## 2012-10-07 DIAGNOSIS — G309 Alzheimer's disease, unspecified: Secondary | ICD-10-CM | POA: Diagnosis present

## 2012-10-07 DIAGNOSIS — H409 Unspecified glaucoma: Secondary | ICD-10-CM | POA: Diagnosis present

## 2012-10-07 DIAGNOSIS — K922 Gastrointestinal hemorrhage, unspecified: Secondary | ICD-10-CM

## 2012-10-07 HISTORY — DX: Overactive bladder: N32.81

## 2012-10-07 HISTORY — DX: Alzheimer's disease, unspecified: G30.9

## 2012-10-07 HISTORY — DX: Hyperlipidemia, unspecified: E78.5

## 2012-10-07 HISTORY — DX: Dementia in other diseases classified elsewhere, unspecified severity, without behavioral disturbance, psychotic disturbance, mood disturbance, and anxiety: F02.80

## 2012-10-07 HISTORY — DX: Essential (primary) hypertension: I10

## 2012-10-07 LAB — CBC WITH DIFFERENTIAL/PLATELET
HCT: 35.7 % — ABNORMAL LOW (ref 36.0–46.0)
Hemoglobin: 11.8 g/dL — ABNORMAL LOW (ref 12.0–15.0)
Lymphocytes Relative: 18 % (ref 12–46)
Monocytes Absolute: 0.2 10*3/uL (ref 0.1–1.0)
Monocytes Relative: 3 % (ref 3–12)
Neutro Abs: 5.5 10*3/uL (ref 1.7–7.7)
WBC: 7.1 10*3/uL (ref 4.0–10.5)

## 2012-10-07 LAB — COMPREHENSIVE METABOLIC PANEL
BUN: 22 mg/dL (ref 6–23)
CO2: 23 mEq/L (ref 19–32)
Chloride: 105 mEq/L (ref 96–112)
Creatinine, Ser: 1.13 mg/dL — ABNORMAL HIGH (ref 0.50–1.10)
GFR calc non Af Amer: 42 mL/min — ABNORMAL LOW (ref >90)
Total Bilirubin: 0.3 mg/dL (ref 0.3–1.2)

## 2012-10-07 LAB — PROTIME-INR: INR: 1.04 (ref 0.00–1.49)

## 2012-10-07 LAB — CBC
Hemoglobin: 8.9 g/dL — ABNORMAL LOW (ref 12.0–15.0)
MCHC: 33 g/dL (ref 30.0–36.0)
RBC: 2.95 MIL/uL — ABNORMAL LOW (ref 3.87–5.11)
WBC: 7 10*3/uL (ref 4.0–10.5)

## 2012-10-07 MED ORDER — FLUTICASONE PROPIONATE 50 MCG/ACT NA SUSP
2.0000 | Freq: Every day | NASAL | Status: DC
  Administered 2012-10-07 – 2012-10-11 (×5): 2 via NASAL
  Filled 2012-10-07: qty 16

## 2012-10-07 MED ORDER — ONDANSETRON HCL 4 MG/2ML IJ SOLN
4.0000 mg | Freq: Four times a day (QID) | INTRAMUSCULAR | Status: DC | PRN
  Administered 2012-10-08 (×2): 4 mg via INTRAVENOUS
  Filled 2012-10-07 (×2): qty 2

## 2012-10-07 MED ORDER — MORPHINE SULFATE 2 MG/ML IJ SOLN
1.0000 mg | INTRAMUSCULAR | Status: DC | PRN
  Filled 2012-10-07: qty 1

## 2012-10-07 MED ORDER — DORZOLAMIDE HCL 2 % OP SOLN
1.0000 [drp] | Freq: Two times a day (BID) | OPHTHALMIC | Status: DC
  Administered 2012-10-07 – 2012-10-11 (×8): 1 [drp] via OPHTHALMIC
  Filled 2012-10-07: qty 10

## 2012-10-07 MED ORDER — MEMANTINE HCL 10 MG PO TABS
10.0000 mg | ORAL_TABLET | Freq: Every day | ORAL | Status: DC
  Administered 2012-10-07 – 2012-10-11 (×5): 10 mg via ORAL
  Filled 2012-10-07 (×5): qty 1

## 2012-10-07 MED ORDER — SODIUM CHLORIDE 0.9 % IV SOLN
INTRAVENOUS | Status: AC
  Administered 2012-10-07 – 2012-10-08 (×2): via INTRAVENOUS

## 2012-10-07 MED ORDER — PEG 3350-KCL-NA BICARB-NACL 420 G PO SOLR: 4000.0000 mL | Freq: Once | ORAL | Status: DC

## 2012-10-07 MED ORDER — SODIUM CHLORIDE 0.9 % IJ SOLN
3.0000 mL | Freq: Two times a day (BID) | INTRAMUSCULAR | Status: DC
  Administered 2012-10-08 – 2012-10-11 (×3): 3 mL via INTRAVENOUS

## 2012-10-07 MED ORDER — ACETAMINOPHEN 325 MG PO TABS: 650.0000 mg | ORAL_TABLET | Freq: Four times a day (QID) | ORAL | Status: DC | PRN

## 2012-10-07 MED ORDER — PILOCARPINE HCL 2 % OP SOLN
1.0000 [drp] | Freq: Four times a day (QID) | OPHTHALMIC | Status: DC
  Administered 2012-10-07 – 2012-10-11 (×17): 1 [drp] via OPHTHALMIC
  Filled 2012-10-07: qty 15

## 2012-10-07 MED ORDER — BIMATOPROST 0.01 % OP SOLN
1.0000 [drp] | Freq: Every day | OPHTHALMIC | Status: DC
  Administered 2012-10-07 – 2012-10-10 (×4): 1 [drp] via OPHTHALMIC
  Filled 2012-10-07: qty 2.5

## 2012-10-07 MED ORDER — ONDANSETRON HCL 4 MG PO TABS: 4.0000 mg | ORAL_TABLET | Freq: Four times a day (QID) | ORAL | Status: DC | PRN

## 2012-10-07 MED ORDER — DONEPEZIL HCL 10 MG PO TABS
10.0000 mg | ORAL_TABLET | Freq: Every day | ORAL | Status: DC
  Administered 2012-10-07 – 2012-10-10 (×4): 10 mg via ORAL
  Filled 2012-10-07 (×5): qty 1

## 2012-10-07 MED ORDER — ACETAMINOPHEN 650 MG RE SUPP: 650.0000 mg | Freq: Four times a day (QID) | RECTAL | Status: DC | PRN

## 2012-10-07 MED ORDER — PEG 3350-KCL-NA BICARB-NACL 420 G PO SOLR
4000.0000 mL | Freq: Once | ORAL | Status: AC
  Administered 2012-10-07: 4000 mL via ORAL

## 2012-10-07 MED ORDER — PREDNISOLONE ACETATE 1 % OP SUSP: 1.0000 [drp] | OPHTHALMIC | Status: DC | PRN

## 2012-10-07 MED ORDER — LORATADINE 10 MG PO TABS
10.0000 mg | ORAL_TABLET | Freq: Every day | ORAL | Status: DC | PRN
  Filled 2012-10-07: qty 1

## 2012-10-07 NOTE — ED Provider Notes (Signed)
History    CSN: [id number] Arrival date & time 10/07/12  0601  First MD Initiated Contact with Patient 10/07/12 0602     Chief Complaint  Patient presents with  . Rectal Bleeding   (Consider location/radiation/quality/duration/timing/severity/associated sxs/prior Treatment) HPI 79 yo female presents to the ER via EMS from her nursing facility.  Pt being transported due to GI bleed.  It is reported she has had two large bloody bowel movements, initially maroon but the last one being more bright red.  No prior h/o gi bleed.  Pt without complaint.  Pt noted to have hr in 50s and bps 90/50s in route.  No reported fever, vomiting.    Past Medical History  Diagnosis Date  . Glaucoma   . Hypertension   . Alzheimer's dementia   . Hyperlipidemia   . Overactive bladder    Past Surgical History  Procedure Laterality Date  . Abdominal hysterectomy     History reviewed. No pertinent family history. History  Substance Use Topics  . Smoking status: Never Smoker   . Smokeless tobacco: Never Used  . Alcohol Use: No   OB History   Grav Para Term Preterm Abortions TAB SAB Ect Mult Living                 Review of Systems  Unable to perform ROS: Dementia    Allergies  Review of patient's allergies indicates no known allergies.  Home Medications   Current Outpatient Rx  Name  Route  Sig  Dispense  Refill  . acetaminophen (TYLENOL) 325 MG tablet   Oral   Take 650 mg by mouth every morning.          Marland Kitchen acetaminophen (TYLENOL) 500 MG tablet   Oral   Take 1 tablet (500 mg total) by mouth every 6 (six) hours as needed for pain.   30 tablet   0   . aspirin 81 MG chewable tablet   Oral   Chew 81 mg by mouth daily.         . bimatoprost (LUMIGAN) 0.01 % SOLN   Both Eyes   Place 1 drop into both eyes at bedtime.         . cholecalciferol (VITAMIN D) 1000 UNITS tablet   Oral   Take 2,000 Units by mouth daily.         Marland Kitchen donepezil (ARICEPT) 10 MG tablet   Oral   Take  10 mg by mouth at bedtime.         . dorzolamide (TRUSOPT) 2 % ophthalmic solution   Both Eyes   Place 1 drop into both eyes 2 (two) times daily.         . fluticasone (FLONASE) 50 MCG/ACT nasal spray   Nasal   Place 2 sprays into the nose daily.   16 g   2   . guaiFENesin (MUCINEX) 600 MG 12 hr tablet   Oral   Take 1 tablet (600 mg total) by mouth 2 (two) times daily.   30 tablet   0   . guaifenesin (ROBITUSSIN) 100 MG/5ML syrup   Oral   Take 100 mg by mouth 4 (four) times daily as needed for cough or congestion.         Marland Kitchen ibuprofen (ADVIL,MOTRIN) 400 MG tablet   Oral   Take 1 tablet (400 mg total) by mouth every 8 (eight) hours as needed for pain.   30 tablet   0   . loperamide (IMODIUM  A-D) 2 MG tablet   Oral   Take 4 mg by mouth 4 (four) times daily as needed for diarrhea or loose stools.         Marland Kitchen loratadine (CLARITIN) 10 MG tablet   Oral   Take 10 mg by mouth daily.         . memantine (NAMENDA) 10 MG tablet   Oral   Take 10 mg by mouth daily.          . pilocarpine (PILOCAR) 2 % ophthalmic solution   Both Eyes   Place 1 drop into both eyes 4 (four) times daily.         . prednisoLONE acetate (PRED FORTE) 1 % ophthalmic suspension   Both Eyes   Place 1 drop into both eyes as needed (as needed).          There were no vitals taken for this visit. Physical Exam  Nursing note and vitals reviewed. Constitutional: She appears well-developed and well-nourished. No distress.  HENT:  Head: Normocephalic and atraumatic.  Eyes: Conjunctivae and EOM are normal. Pupils are equal, round, and reactive to light.  Neck: Normal range of motion. Neck supple. No JVD present. No tracheal deviation present. No thyromegaly present.  Cardiovascular: Normal heart sounds and intact distal pulses.  Exam reveals no gallop and no friction rub.   No murmur heard. bradycardia  Pulmonary/Chest: Effort normal and breath sounds normal. No stridor. No respiratory  distress. She has no wheezes. She has no rales. She exhibits no tenderness.  Abdominal: Soft. She exhibits no mass. There is tenderness (mild llq). There is no rebound and no guarding.  Hyperactive bowel sounds  Musculoskeletal: Normal range of motion. She exhibits no edema and no tenderness.  Lymphadenopathy:    She has no cervical adenopathy.  Neurological: She exhibits normal muscle tone. Coordination normal.    ED Course  Procedures (including critical care time) Labs Reviewed  CBC WITH DIFFERENTIAL - Abnormal; Notable for the following:    Hemoglobin 11.8 (*)    HCT 35.7 (*)    Neutrophils Relative % 78 (*)    All other components within normal limits  COMPREHENSIVE METABOLIC PANEL - Abnormal; Notable for the following:    Glucose, Bld 177 (*)    Creatinine, Ser 1.13 (*)    GFR calc non Af Amer 42 (*)    GFR calc Af Amer 48 (*)    All other components within normal limits  CG4 I-STAT (LACTIC ACID) - Abnormal; Notable for the following:    Lactic Acid, Venous 2.78 (*)    All other components within normal limits  OCCULT BLOOD, POC DEVICE - Abnormal; Notable for the following:    Fecal Occult Bld POSITIVE (*)    All other components within normal limits  PROTIME-INR  TYPE AND SCREEN  ABO/RH   No results found. 1. GI bleed     MDM  79 yo female with GI bleed.  Will get labs, transfuse if necessary.  Will closely monitor.  8:10 AM Pt has had no further bloody bm, but rectal exam shows blood.  H/h stable, BP stable.  Pt without complaint.  Given reported large volume of bloody stool, will d/w hospitalist for admission.   Olivia Mackie, MD 10/07/12 [phone]

## 2012-10-07 NOTE — Progress Notes (Signed)
Consulted by [facility] in house staff

## 2012-10-07 NOTE — Progress Notes (Signed)
UR completed 

## 2012-10-07 NOTE — ED Notes (Signed)
[id number]<QI> Expected date:10/07/12<BR> Expected time: 5:49 AM<BR> Means of arrival:Ambulance<BR> Comments:<BR> Rectal bleeding, hypotension, bradycardia

## 2012-10-07 NOTE — Progress Notes (Signed)
Patient does not have a computer to activate Mychart

## 2012-10-07 NOTE — H&P (Signed)
[facility] History and Physical  Robin Gregory IRJ:[id number] DOB: December 30, 1921 DOA: 10/07/2012  Referring physician: Dr. Norlene Campbell. PCP: Alva Garnet., MD   Chief Complaint: bright red blood per rectum  HPI: Robin Gregory is a 79 y.o. female with PMH significant for glaucoma, HLD, dementia and allergic rhinitis; came to ED from SNF due to acute episodes of bright red blood per rectum. Per nursing home reports patient has to copious BM of pure blood; patient with small amount of blood per rectum in the ED as well. Patient denies abd pain, nausea, vomiting, diarrhea, constipation, fever, chills, CP, SOB or any other complaints. She has never had a colonoscopy. On admission Hgb 11.8 and BMET WNL, [facility] called to admit patient for further evaluation and treatment.   Review of Systems:  Negative except as otherwise mentioned on HPI.  Past Medical History  Diagnosis Date  . Glaucoma   . Hypertension   . Alzheimer's dementia   . Hyperlipidemia   . Overactive bladder    Past Surgical History  Procedure Laterality Date  . Abdominal hysterectomy     Social History:  reports that she has never smoked. She has never used smokeless tobacco. She reports that she does not drink alcohol or use illicit drugs. Lives at SNF. Minimally assistance with ADL's  No Known Allergies  Family Hx: positive for HTN; otherwise not significant according to patient.  Prior to Admission medications   Medication Sig Start Date End Date Taking? Authorizing Provider  aspirin 81 MG chewable tablet Chew 81 mg by mouth daily.   Yes Historical Provider, MD  bimatoprost (LUMIGAN) 0.01 % SOLN Place 1 drop into both eyes at bedtime.   Yes Historical Provider, MD  cholecalciferol (VITAMIN D) 1000 UNITS tablet Take 2,000 Units by mouth daily.   Yes Historical Provider, MD  clindamycin (CLEOCIN) 150 MG capsule Take 150 mg by mouth 3 (three) times daily. Stop date:10/10/2012   Yes Historical Provider, MD  donepezil  (ARICEPT) 10 MG tablet Take 10 mg by mouth at bedtime.   Yes Historical Provider, MD  dorzolamide (TRUSOPT) 2 % ophthalmic solution Place 1 drop into both eyes 2 (two) times daily.   Yes Historical Provider, MD  fluticasone (FLONASE) 50 MCG/ACT nasal spray Place 2 sprays into the nose daily. 07/06/12  Yes Peter S Dammen, PA-C  loratadine (CLARITIN) 10 MG tablet Take 10 mg by mouth daily as needed for allergies.    Yes Historical Provider, MD  memantine (NAMENDA) 10 MG tablet Take 10 mg by mouth daily.    Yes Historical Provider, MD  pilocarpine (PILOCAR) 2 % ophthalmic solution Place 1 drop into both eyes 4 (four) times daily.   Yes Historical Provider, MD  prednisoLONE acetate (PRED FORTE) 1 % ophthalmic suspension Place 1 drop into both eyes as needed (as needed).   Yes Historical Provider, MD  guaiFENesin (MUCINEX) 600 MG 12 hr tablet Take 1 tablet (600 mg total) by mouth 2 (two) times daily. 07/06/12   Phill Mutter Dammen, PA-C  guaifenesin (ROBITUSSIN) 100 MG/5ML syrup Take 100 mg by mouth 4 (four) times daily as needed for cough or congestion.    Historical Provider, MD  ibuprofen (ADVIL,MOTRIN) 400 MG tablet Take 1 tablet (400 mg total) by mouth every 8 (eight) hours as needed for pain. 07/06/12   Phill Mutter Dammen, PA-C  loperamide (IMODIUM A-D) 2 MG tablet Take 4 mg by mouth 4 (four) times daily as needed for diarrhea or loose stools.    Historical  Provider, MD   Physical Exam: Filed Vitals:   10/07/12 0815 10/07/12 0830 10/07/12 0845 10/07/12 0921  BP: 89/75 114/60 108/56 122/66  Pulse: 58 55  66  Temp:    97.4 F (36.3 C)  TempSrc:    Oral  Resp: 18 15 14 16   Height:    5\' 5"  (1.651 m)  Weight:    71.9 kg (158 lb 8.2 oz)  SpO2: 100% 100%  100%   General: afebrile, able to follow commands, no acute distress   Head: Normocephalic and atraumatic.  Eyes: Conjunctivae and EOM are normal. Pupils are equal, round, and reactive to light.  Neck: Normal range of motion. Neck supple. No JVD present.  No tracheal deviation present. No thyromegaly present.  Cardiovascular: Normal heart sounds and intact distal pulses. Exam reveals no gallop and no friction rub.  No murmur heard. Pulmonary/Chest: Effort normal and breath sounds normal. No stridor. Clear to auscultation bilaterally. Abdominal: Soft; no tenderness, no distension, positive BS Musculoskeletal: Normal range of motion. She exhibits no edema and no tenderness.  Neurological: She exhibits normal muscle tone. Coordination normal. AAOX2 (baseline); no focal deficit   Labs on Admission:  Basic Metabolic Panel:  Recent Labs Lab 10/07/12 0615  NA 139  K 4.7  CL 105  CO2 23  GLUCOSE 177*  BUN 22  CREATININE 1.13*  CALCIUM 9.2   Liver Function Tests:  Recent Labs Lab 10/07/12 0615  AST 18  ALT 15  ALKPHOS 107  BILITOT 0.3  PROT 6.8  ALBUMIN 3.6   CBC:  Recent Labs Lab 10/07/12 0615  WBC 7.1  NEUTROABS 5.5  HGB 11.8*  HCT 35.7*  MCV 92.0  PLT 224   Radiological Exams on Admission: No results found.   Assessment/Plan 1-GI bleed:most likely lower GI bleed (internal hemorrhoids vs AVM vs diverticulosis vs malignancy) -will admit to telemetry -type and screen -follow Hgb trend -GI consult for colonoscopy -currently hemodynamically stable  -full liquid diet -hold ASA  2-Dementia:continue namenda and aricept  3-Glaucoma:continue current eyes drop therapy  4-HLD (hyperlipidemia):hx of HLD. Following low fat diet as an outpatient. Not on statins. Will get lipid panel.  5-Allergic rhinitis:continue flonase and claritin  DVT: SCD's    GI (Dr. Loreta Ave)  Code Status: Full Family Communication: daughter at bedside Disposition Plan: Inpatient, telemetry; LOs > 2 midnights  Time spent: 50 minutes  Errin Chewning [facility] Pager [phone]  If 7PM-7AM, please contact night-coverage [email or web address] Password [email or web address] 10/07/2012, 1:06 PM

## 2012-10-07 NOTE — ED Notes (Signed)
Brought in by EMS from [facility] with c/o rectal bleeding.  Per EMS, staff at the facility reported that pt started having rectal bleeding at around 0130 today and is becoming persistent.

## 2012-10-07 NOTE — Progress Notes (Signed)
EC CM consulted with Medical director and left message for Daphene Jaeger, [facility])

## 2012-10-07 NOTE — Consult Note (Signed)
Unassigned patient.  Reason for Consult: Rectal bleeding. Referring Physician: Truman Hayward  Robin Gregory is an 79 y.o. female.  HPI: Patient is a 79 year old black female, admitted with a a history of rectal bleeding since yesterday. She denies having any other GI complaints at this time; there is no history of abdominal pain, nausea, vomiting, diarrhea or constipation, dysphagia or odynophagia. She usually has 1 BM per day. She has a good appatite and her weight has been stable. She denies a family history of colon cancer.  Past Medical History  Diagnosis Date  . Glaucoma   . Hypertension   . Alzheimer's dementia   . Hyperlipidemia   . Overactive bladder        Dysfunctional uterine bleeding Past Surgical History  Procedure Laterality Date  . Abdominal hysterectomy     History reviewed. No pertinent family history.  Social History:  reports that she has never smoked. She has never used smokeless tobacco. She reports that she does not drink alcohol or use illicit drugs.  Allergies: No Known Allergies  Medications: I have reviewed the patient's current medications.  Results for orders placed during the hospital encounter of 10/07/12 (from the past 48 hour(s))  ABO/RH     Status: None   Collection Time    10/07/12  6:00 AM      Result Value Range   ABO/RH(D) B POS    CBC WITH DIFFERENTIAL     Status: Abnormal   Collection Time    10/07/12  6:15 AM      Result Value Range   WBC 7.1  4.0 - 10.5 K/uL   RBC 3.88  3.87 - 5.11 MIL/uL   Hemoglobin 11.8 (*) 12.0 - 15.0 g/dL   HCT 45.4 (*) 09.8 - 11.9 %   MCV 92.0  78.0 - 100.0 fL   MCH 30.4  26.0 - 34.0 pg   MCHC 33.1  30.0 - 36.0 g/dL   RDW 14.7  82.9 - 56.2 %   Platelets 224  150 - 400 K/uL   Neutrophils Relative % 78 (*) 43 - 77 %   Neutro Abs 5.5  1.7 - 7.7 K/uL   Lymphocytes Relative 18  12 - 46 %   Lymphs Abs 1.3  0.7 - 4.0 K/uL   Monocytes Relative 3  3 - 12 %   Monocytes Absolute 0.2  0.1 - 1.0 K/uL   Eosinophils Relative 0  0 - 5 %   Eosinophils Absolute 0.0  0.0 - 0.7 K/uL   Basophils Relative 0  0 - 1 %   Basophils Absolute 0.0  0.0 - 0.1 K/uL  COMPREHENSIVE METABOLIC PANEL     Status: Abnormal   Collection Time    10/07/12  6:15 AM      Result Value Range   Sodium 139  135 - 145 mEq/L   Potassium 4.7  3.5 - 5.1 mEq/L   Chloride 105  96 - 112 mEq/L   CO2 23  19 - 32 mEq/L   Glucose, Bld 177 (*) 70 - 99 mg/dL   BUN 22  6 - 23 mg/dL   Creatinine, Ser 1.30 (*) 0.50 - 1.10 mg/dL   Calcium 9.2  8.4 - 86.5 mg/dL   Total Protein 6.8  6.0 - 8.3 g/dL   Albumin 3.6  3.5 - 5.2 g/dL   AST 18  0 - 37 U/L   ALT 15  0 - 35 U/L   Alkaline Phosphatase 107  39 - 117 U/L   Total Bilirubin 0.3  0.3 - 1.2 mg/dL   GFR calc non Af Amer 42 (*) >90 mL/min   GFR calc Af Amer 48 (*) >90 mL/min   Comment:            The eGFR has been calculated     using the CKD EPI equation.     This calculation has not been     validated in all clinical     situations.     eGFR's persistently     <90 mL/min signify     possible Chronic Kidney Disease.  TYPE AND SCREEN     Status: None   Collection Time    10/07/12  6:15 AM      Result Value Range   ABO/RH(D) B POS     Antibody Screen NEG     Sample Expiration 10/10/2012    PROTIME-INR     Status: None   Collection Time    10/07/12  6:15 AM      Result Value Range   Prothrombin Time 13.4  11.6 - 15.2 seconds   INR 1.04  0.00 - 1.49  CG4 I-STAT (LACTIC ACID)     Status: Abnormal   Collection Time    10/07/12  6:33 AM      Result Value Range   Lactic Acid, Venous 2.78 (*) 0.5 - 2.2 mmol/L  OCCULT BLOOD, POC DEVICE     Status: Abnormal   Collection Time    10/07/12  6:42 AM      Result Value Range   Fecal Occult Bld POSITIVE (*) NEGATIVE   No results found.  Review of Systems  Constitutional: Positive for malaise/fatigue. Negative for fever, chills, weight loss and diaphoresis.  HENT: Negative.   Respiratory: Negative.   Cardiovascular:  Negative.   Gastrointestinal: Positive for blood in stool. Negative for heartburn, nausea, vomiting, abdominal pain, diarrhea and constipation.  Genitourinary: Negative.   Musculoskeletal: Positive for back pain and joint pain.  Skin: Negative.   Neurological: Negative.   Endo/Heme/Allergies: Negative.   Psychiatric/Behavioral: Positive for hallucinations and memory loss. Negative for suicidal ideas and substance abuse. The patient is nervous/anxious. The patient does not have insomnia.    Blood pressure 112/64, pulse 61, temperature 98.5 F (36.9 C), temperature source Oral, resp. rate 16, height 5\' 5"  (1.651 m), weight 71.9 kg (158 lb 8.2 oz), SpO2 100.00%. Physical Exam  Constitutional: She is oriented to person, place, and time. She appears well-developed and well-nourished.  HENT:  Head: Normocephalic and atraumatic.  Her teeth are in poor repair  Eyes: Conjunctivae and EOM are normal. Pupils are equal, round, and reactive to light.  Neck: Normal range of motion. Neck supple.  Cardiovascular: Normal rate and regular rhythm.   Respiratory: Effort normal and breath sounds normal.  GI: Soft. Bowel sounds are normal. She exhibits no distension and no mass. There is no tenderness. There is no rebound and no guarding.    Scar present in the midline below the umbilicus from a previous hysterectomy  Musculoskeletal: Normal range of motion.  Neurological: She is alert and oriented to person, place, and time.  Skin: Skin is warm and dry.  Psychiatric: She has a normal mood and affect. Her behavior is normal. Judgment and thought content normal.   Assessment/Plan: 1) Rectal bleeding/mild anemia : a colonoscopy is planned for tomorrow at 330 pm; will prep tonight. I suspect she may have a diverticular bleed.  2) Hyperlipidemia .  3) Dementia.  4) Allergic rhinitis.  5) Glaucoma. 6) Hyperglycemia: check fasting HbA1c. Caymen Dubray 10/07/2012, 5:24 PM

## 2012-10-08 ENCOUNTER — Encounter (HOSPITAL_COMMUNITY): Payer: Self-pay

## 2012-10-08 ENCOUNTER — Encounter (HOSPITAL_COMMUNITY)
Admission: EM | Disposition: A | Discharge status: Nursing Home (Includes ICF) | Payer: Self-pay | Source: Home / Self Care | Attending: Internal Medicine

## 2012-10-08 DIAGNOSIS — E785 Hyperlipidemia, unspecified: Secondary | ICD-10-CM

## 2012-10-08 HISTORY — PX: COLONOSCOPY: SHX5424

## 2012-10-08 LAB — BASIC METABOLIC PANEL
BUN: 12 mg/dL (ref 6–23)
CO2: 23 mEq/L (ref 19–32)
Chloride: 113 mEq/L — ABNORMAL HIGH (ref 96–112)
GFR calc Af Amer: 66 mL/min — ABNORMAL LOW (ref >90)
Potassium: 3.8 mEq/L (ref 3.5–5.1)

## 2012-10-08 LAB — CBC
HCT: 23.7 % — ABNORMAL LOW (ref 36.0–46.0)
MCV: 84.9 fL (ref 78.0–100.0)
Platelets: 176 10*3/uL (ref 150–400)
RBC: 2.6 MIL/uL — ABNORMAL LOW (ref 3.87–5.11)
RBC: 4.07 MIL/uL (ref 3.87–5.11)
RDW: 15.2 % (ref 11.5–15.5)
RDW: 20.8 % — ABNORMAL HIGH (ref 11.5–15.5)
WBC: 5.9 10*3/uL (ref 4.0–10.5)
WBC: 9.3 10*3/uL (ref 4.0–10.5)

## 2012-10-08 LAB — LIPID PANEL
Cholesterol: 116 mg/dL (ref 0–200)
HDL: 42 mg/dL (ref >39)
Total CHOL/HDL Ratio: 2.8 RATIO

## 2012-10-08 LAB — TSH: TSH: 0.677 u[IU]/mL (ref 0.350–4.500)

## 2012-10-08 SURGERY — COLONOSCOPY
Anesthesia: Moderate Sedation

## 2012-10-08 MED ORDER — FUROSEMIDE 10 MG/ML IJ SOLN: 20.0000 mg | Freq: Once | INTRAMUSCULAR | Status: AC

## 2012-10-08 MED ORDER — MIDAZOLAM HCL 10 MG/2ML IJ SOLN
INTRAMUSCULAR | Status: AC
  Filled 2012-10-08: qty 4

## 2012-10-08 MED ORDER — MIDAZOLAM HCL 5 MG/5ML IJ SOLN
INTRAMUSCULAR | Status: DC | PRN
  Administered 2012-10-08 (×2): 1 mg via INTRAVENOUS

## 2012-10-08 MED ORDER — DIPHENHYDRAMINE HCL 50 MG/ML IJ SOLN
INTRAMUSCULAR | Status: AC
  Filled 2012-10-08: qty 1

## 2012-10-08 MED ORDER — FENTANYL CITRATE 0.05 MG/ML IJ SOLN
INTRAMUSCULAR | Status: DC | PRN
  Administered 2012-10-08 (×2): 25 ug via INTRAVENOUS

## 2012-10-08 MED ORDER — SODIUM CHLORIDE 0.9 % IV SOLN
INTRAVENOUS | Status: DC
  Administered 2012-10-08 – 2012-10-10 (×2): via INTRAVENOUS

## 2012-10-08 MED ORDER — FENTANYL CITRATE 0.05 MG/ML IJ SOLN
INTRAMUSCULAR | Status: AC
  Filled 2012-10-08: qty 4

## 2012-10-08 MED ORDER — FUROSEMIDE 10 MG/ML IJ SOLN
INTRAMUSCULAR | Status: AC
  Administered 2012-10-08: 20 mg via INTRAVENOUS
  Filled 2012-10-08: qty 4

## 2012-10-08 NOTE — Progress Notes (Addendum)
[facility] PROGRESS NOTE  Shantil S Mura MRN:[id number] DOB: 06/23/1921 DOA: 10/07/2012 PCP: SHELTON,KIMBERLY R., MD  Assessment/Plan: 1-GI bleed: most likely lower GI bleed (internal hemorrhoids vs AVM vs diverticulosis vs malignancy)  -bleeding has continue and Hgb 7.9 today; patient is symptomatic -will transfuse 2 units of PRBC's -colonoscopy today per GI rec's -follow Hgb trend  -continue holding ASA   2-Dementia:continue namenda and aricept   3-Glaucoma:continue current eyes drop therapy (lumigan, dorzolamide and pilocarpine)  4-HLD (hyperlipidemia):hx of HLD. Following low fat diet as an outpatient. Not on statins. Lipid panel with LDL 66, TG 38 and HDL 42  5-Allergic rhinitis:continue flonase and claritin  6-ABLA: Hgb 7.9 today. Due to GI bleed. Treatment as mentioned on #1  DVT: SCD's   Code Status: Full Family Communication: no family at bedside Disposition Plan: back to facility at discharge   Consultants:  GI  Procedures:  Plan is for colonoscopy this PM  Antibiotics:  none  HPI/Subjective: Afebrile, no abd pain. Ongoing bleeding through   Objective: Filed Vitals:   10/07/12 1412 10/07/12 2114 10/08/12 0027 10/08/12 0533  BP: 112/64 132/92 111/62 105/66  Pulse: 61 70  75  Temp: 98.5 F (36.9 C) 97.4 F (36.3 C)  98.2 F (36.8 C)  TempSrc: Oral Oral  Oral  Resp: 16 16  14  Height:      Weight:      SpO2: 100% 100%  99%    Intake/Output Summary (Last 24 hours) at 10/08/12 0901 Last data filed at 10/08/12 0600  Gross per 24 hour  Intake 1308.75 ml  Output    101 ml  Net 1207.75 ml   Filed Weights   10/07/12 0921  Weight: 71.9 kg (158 lb 8.2 oz)    Exam:   General:  Afebrile, NAD; reports she is feeling tire  Cardiovascular: S1 and S2, no rubs or gallops  Respiratory: CTA bilaterally  Abdomen: soft, NT, ND, positive BS  Musculoskeletal: no edema and no cyanosis  Data Reviewed: Basic Metabolic Panel:  Recent  Labs Lab 10/07/12 0615 10/08/12 0450  NA 139 141  K 4.7 3.8  CL 105 113*  CO2 23 23  GLUCOSE 177* 108*  BUN 22 12  CREATININE 1.13* 0.88  CALCIUM 9.2 8.0*   Liver Function Tests:  Recent Labs Lab 10/07/12 0615  AST 18  ALT 15  ALKPHOS 107  BILITOT 0.3  PROT 6.8  ALBUMIN 3.6   CBC:  Recent Labs Lab 10/07/12 0615 10/07/12 1731 10/08/12 0450  WBC 7.1 7.0 5.9  NEUTROABS 5.5  --   --   HGB 11.8* 8.9* 7.9*  HCT 35.7* 27.0* 23.7*  MCV 92.0 91.5 91.2  PLT 224 178 148*       Studies: No results found.  Scheduled Meds: . bimatoprost  1 drop Both Eyes QHS  . donepezil  10 mg Oral QHS  . dorzolamide  1 drop Both Eyes BID  . fluticasone  2 spray Each Nare Daily  . furosemide  20 mg Intravenous Once  . memantine  10 mg Oral Daily  . pilocarpine  1 drop Both Eyes QID  . sodium chloride  3 mL Intravenous Q12H   Continuous Infusions:   Principal Problem:   GI bleed Active Problems:   Dementia   Glaucoma   HLD (hyperlipidemia)   Allergic rhinitis    Time spent: >30 minutes    Rourke Mcquitty  [facility] Pager [phone]. If 7PM-7AM, please contact night-coverage at [email or web address], password [email or web address] 10/08/2012, 9:01 AM    LOS: 1 day              

## 2012-10-08 NOTE — H&P (View-Only) (Signed)
[facility] PROGRESS NOTE  Robin Gregory NFA:[id number] DOB: 05/07/1921 DOA: 10/07/2012 PCP: Alva Garnet., MD  Assessment/Plan: 1-GI bleed: most likely lower GI bleed (internal hemorrhoids vs AVM vs diverticulosis vs malignancy)  -bleeding has continue and Hgb 7.9 today; patient is symptomatic -will transfuse 2 units of PRBC's -colonoscopy today per GI rec's -follow Hgb trend  -continue holding ASA   2-Dementia:continue namenda and aricept   3-Glaucoma:continue current eyes drop therapy (lumigan, dorzolamide and pilocarpine)  4-HLD (hyperlipidemia):hx of HLD. Following low fat diet as an outpatient. Not on statins. Lipid panel with LDL 66, TG 38 and HDL 42  5-Allergic rhinitis:continue flonase and claritin  6-ABLA: Hgb 7.9 today. Due to GI bleed. Treatment as mentioned on #1  DVT: SCD's   Code Status: Full Family Communication: no family at bedside Disposition Plan: back to facility at discharge   Consultants:  GI  Procedures:  Plan is for colonoscopy this PM  Antibiotics:  none  HPI/Subjective: Afebrile, no abd pain. Ongoing bleeding through   Objective: Filed Vitals:   10/07/12 1412 10/07/12 2114 10/08/12 0027 10/08/12 0533  BP: 112/64 132/92 111/62 105/66  Pulse: 61 70  75  Temp: 98.5 F (36.9 C) 97.4 F (36.3 C)  98.2 F (36.8 C)  TempSrc: Oral Oral  Oral  Resp: 16 16  14   Height:      Weight:      SpO2: 100% 100%  99%    Intake/Output Summary (Last 24 hours) at 10/08/12 0901 Last data filed at 10/08/12 0600  Gross per 24 hour  Intake 1308.75 ml  Output    101 ml  Net 1207.75 ml   Filed Weights   10/07/12 0921  Weight: 71.9 kg (158 lb 8.2 oz)    Exam:   General:  Afebrile, NAD; reports she is feeling tire  Cardiovascular: S1 and S2, no rubs or gallops  Respiratory: CTA bilaterally  Abdomen: soft, NT, ND, positive BS  Musculoskeletal: no edema and no cyanosis  Data Reviewed: Basic Metabolic Panel:  Recent  Labs Lab 10/07/12 0615 10/08/12 0450  NA 139 141  K 4.7 3.8  CL 105 113*  CO2 23 23  GLUCOSE 177* 108*  BUN 22 12  CREATININE 1.13* 0.88  CALCIUM 9.2 8.0*   Liver Function Tests:  Recent Labs Lab 10/07/12 0615  AST 18  ALT 15  ALKPHOS 107  BILITOT 0.3  PROT 6.8  ALBUMIN 3.6   CBC:  Recent Labs Lab 10/07/12 0615 10/07/12 1731 10/08/12 0450  WBC 7.1 7.0 5.9  NEUTROABS 5.5  --   --   HGB 11.8* 8.9* 7.9*  HCT 35.7* 27.0* 23.7*  MCV 92.0 91.5 91.2  PLT 224 178 148*       Studies: No results found.  Scheduled Meds: . bimatoprost  1 drop Both Eyes QHS  . donepezil  10 mg Oral QHS  . dorzolamide  1 drop Both Eyes BID  . fluticasone  2 spray Each Nare Daily  . furosemide  20 mg Intravenous Once  . memantine  10 mg Oral Daily  . pilocarpine  1 drop Both Eyes QID  . sodium chloride  3 mL Intravenous Q12H   Continuous Infusions:   Principal Problem:   GI bleed Active Problems:   Dementia   Glaucoma   HLD (hyperlipidemia)   Allergic rhinitis    Time spent: >30 minutes    Robin Gregory  [facility] Pager [phone]. If 7PM-7AM, please contact night-coverage at [email or web address], password [facility] 10/08/2012, 9:01 AM  LOS: 1 day

## 2012-10-08 NOTE — Clinical Documentation Improvement (Signed)
THIS DOCUMENT IS NOT A PERMANENT PART OF THE MEDICAL RECORD  Please update your documentation with the medical record to reflect your response to this query. If you need help knowing how to do this please call [phone].  10/08/12  Dear Dr Vassie Loll and.Associates  In an effort to better capture your patient's severity of illness, reflect appropriate length of stay and utilization of resources, a review of the patient medical record has revealed the following indicators.    Based on your clinical judgment, please clarify and document in a progress note and/or discharge summary the clinical condition associated with the following supporting information:  In responding to this query please exercise your independent judgment.  The fact that a query is asked, does not imply that any particular answer is desired or expected.   10/08/12 Progr Note "GI bleed: most likely lower GI bleed (internal hemorrhoids vs AVM vs diverticulosis vs malignancy)  -bleeding has continue and Hgb 7.9 today; patient is symptomatic -will transfuse 2 units of PRBC's -colonoscopy today per GI rec's -follow Hgb trend -continue holding ASA."  After study and for accurate Dx specificity & severity can noted labs findings & tx be clarifed/linked to clinical cond being eval'd, mon'd & tx'd. Thank you   Possible Clinical Conditions?  Expected Acute Blood Loss Anemia Acute Blood Loss Anemia Acute on chronic blood loss anemia Chronic blood loss anemia Precipitous drop in Hematocrit Other Condition Cannot Clinically Determine  Supporting Information: Risk Factors:  10/07/12 ED Note..."reported she has had two large bloody bowel movements, initially maroon but the last one being more bright red. No prior h/o gi bleed.Marland KitchenMarland KitchenMarland KitchenPt noted to have hr in 50s and bps 90/50s in route"..  Signs and Symptoms; See above note   Diagnostics: 10/07/12 adm: Hemoglobin   1.8 (*); HCT  35.7 (*)  10/08/12: HGB  7.9  ; HCT   23.7  Treatments: See above note  Reviewed: acute blood loss anemia due to GI bleed  Thank You,  Toribio Harbour, RN, BSN, CCDS Certified Clinical Documentation Specialist Pager: [phone] Information Management [place]

## 2012-10-08 NOTE — Progress Notes (Signed)
Molli Barrows, NP notified of pt's hgb of 7.9 this am. No new orders. Will cont to monitor pt.

## 2012-10-08 NOTE — Interval H&P Note (Signed)
History and Physical Interval Note:  10/08/2012 3:05 PM  Robin Gregory  has presented today for surgery, with the diagnosis of .  The various methods of treatment have been discussed with the patient and family. After consideration of risks, benefits and other options for treatment, the patient has consented to  Procedure(s): COLONOSCOPY (N/A) as a surgical intervention .  The patient's history has been reviewed, patient examined, no change in status, stable for surgery.  I have reviewed the patient's chart and labs.  Questions were answered to the patient's satisfaction.     Izzabell Klasen D

## 2012-10-08 NOTE — Clinical Social Work Psychosocial (Unsigned)
     Clinical Social Work Department BRIEF PSYCHOSOCIAL ASSESSMENT 10/08/2012  Patient:  Robin Gregory, Robin Gregory     Account Number:  [id number]     Admit date:  10/07/2012  Clinical Social Worker:  Hattie Perch  Date/Time:  10/08/2012 12:00 M  Referred by:  Physician  Date Referred:  10/08/2012 Referred for  ALF Placement   Other Referral:   Interview type:  Family Other interview type:    PSYCHOSOCIAL DATA Living Status:  FACILITY Admitted from facility:  [facility] Level of care:  Assisted Living Primary support name:  Steward Drone Lumpkins Primary support relationship to patient:  CHILD, ADULT Degree of support available:   good    CURRENT CONCERNS Current Concerns  Post-Acute Placement   Other Concerns:    SOCIAL WORK ASSESSMENT / PLAN CSW met with patient and patient's daughter at bedside. patient's daughter confirms that patient is a resident of [facility] and will return there upon discharge.   Assessment/plan status:   Other assessment/ plan:   Information/referral to community resources:    PATIENTS/FAMILYS RESPONSE TO PLAN OF CARE: family is agreeable to patient returning to [facility] upon discharge.

## 2012-10-08 NOTE — Progress Notes (Signed)
Pt had moderate amount of watery, bright red stool. Robin Gregory

## 2012-10-08 NOTE — Progress Notes (Signed)
NT called RN into room stating pt bleeding from rectum a lot.  Pt on bedside commode. Moderate to large amount of bright red blood was noted from rectum. Approximately 300 ml.  Pt complained of dizziness when returned back to bed.  2L O2 applied, vitals stable. MD text paged.

## 2012-10-08 NOTE — Op Note (Signed)
[facility] [address] [place], [address]   OPERATIVE PROCEDURE REPORT  PATIENT: Robin Gregory, Robin Gregory  MR#: [id number] BIRTHDATE: 09/22/1921  GENDER: Female ENDOSCOPIST: Jeani Hawking, MD ASSISTANT:   Karie Soda, technician Felecia Shelling, RN PROCEDURE DATE: 10/08/2012 PROCEDURE:   Colonoscopy, diagnostic ASA CLASS:   Class III INDICATIONS:Hematochezia. MEDICATIONS: Fentanyl 25 mcg IV and Versed 2 mg IV  DESCRIPTION OF PROCEDURE:   After the risks benefits and alternatives of the procedure were thoroughly explained, informed consent was obtained.  A digital rectal exam revealed no abnormalities of the rectum.    The Pentax Colonoscope [id number] endoscope was introduced through the anus  and advanced to the cecum, which was identified by both the appendix and ileocecal valve , No adverse events experienced.    The quality of the prep was good. .  The instrument was then slowly withdrawn as the colon was fully examined.     FINDINGS: Upon initial entry into the colon there was evidence of fresh blood. the colonoscope was advanced to the cecum and there was evidence of diverticula in the cecum, ascending colon, and proximal transverse colon.  Extensive washing was performed and there was no reaccumulation of blood.  Multiple attempts to intubate the TI failed, but no blood was noted to flow from this site.  A few 3-4 mm sessile polyps were noted in the the cecum and ascending colon, but they were not removed with her bleeding history.  No evidence of any masses, ulcerations, erosions, or vascular abnormalities.   Retroflexed views revealed internal/external hemorrhoids.     The scope was then withdrawn from the patient and the procedure terminated.  COMPLICATIONS: There were no complications.  IMPRESSION: 1) Proximal diverticulosis. 2) A few small proximal colon polyps.  RECOMMENDATIONS: 1) Follow HGB and transfuse as necessary. 2) Bleeding scan of  bleeding continues. 3) Consider surgical consultation if bleeding persists.  _______________________________ eSignedJeani Hawking, MD 10/08/2012 3:50 PM

## 2012-10-09 ENCOUNTER — Encounter (HOSPITAL_COMMUNITY): Payer: Self-pay | Admitting: Gastroenterology

## 2012-10-09 DIAGNOSIS — K922 Gastrointestinal hemorrhage, unspecified: Secondary | ICD-10-CM

## 2012-10-09 DIAGNOSIS — K5731 Diverticulosis of large intestine without perforation or abscess with bleeding: Principal | ICD-10-CM

## 2012-10-09 LAB — CBC
Hemoglobin: 8.5 g/dL — ABNORMAL LOW (ref 12.0–15.0)
MCH: 28 pg (ref 26.0–34.0)
MCHC: 33 g/dL (ref 30.0–36.0)
MCHC: 33.2 g/dL (ref 30.0–36.0)
Platelets: 143 10*3/uL — ABNORMAL LOW (ref 150–400)
RBC: 3 MIL/uL — ABNORMAL LOW (ref 3.87–5.11)
RDW: 21 % — ABNORMAL HIGH (ref 11.5–15.5)
WBC: 7 10*3/uL (ref 4.0–10.5)

## 2012-10-09 LAB — TYPE AND SCREEN
ABO/RH(D): B POS
Antibody Screen: NEGATIVE
Unit division: 0

## 2012-10-09 MED ORDER — SODIUM CHLORIDE 0.9 % IV BOLUS (SEPSIS)
500.0000 mL | Freq: Once | INTRAVENOUS | Status: AC
  Administered 2012-10-09: 500 mL via INTRAVENOUS

## 2012-10-09 MED ORDER — FERROUS SULFATE 325 (65 FE) MG PO TABS
325.0000 mg | ORAL_TABLET | Freq: Two times a day (BID) | ORAL | Status: DC
  Administered 2012-10-09 – 2012-10-11 (×4): 325 mg via ORAL
  Filled 2012-10-09 (×7): qty 1

## 2012-10-09 MED ORDER — POLYETHYLENE GLYCOL 3350 17 G PO PACK
17.0000 g | PACK | Freq: Every day | ORAL | Status: DC
  Administered 2012-10-10 – 2012-10-11 (×2): 17 g via ORAL
  Filled 2012-10-09 (×3): qty 1

## 2012-10-09 NOTE — Progress Notes (Signed)
[facility] PROGRESS NOTE  KASARA SCHOMER ZOX:[id number] DOB: [birth year] DOA: 10/07/2012 PCP: Alva Garnet., MD  Assessment/Plan: 1-GI bleed: most likely lower GI bleed (internal hemorrhoids vs AVM vs diverticulosis vs malignancy)  -last episode of bleeding was last night -will start ferrous sulfate -per GI will continue supportive care and slowly advance diet -follow Hgb trend  -continue holding ASA  - if brisk bleeding occurs will need NM bleeding scan and if positive IR angio embolization.  2-Dementia:continue namenda and aricept   3-Glaucoma:continue current eyes drop therapy (lumigan, dorzolamide and pilocarpine)  4-HLD (hyperlipidemia):hx of HLD. Following low fat diet as an outpatient. Not on statins. Lipid panel with LDL 66, TG 38 and HDL 42  5-Allergic rhinitis:continue flonase and claritin  6-ABLA: Hgb 9.0 today.  -will monitor Hgb -start ferrous sulfate -transfuse if Hgb less than 8.0  DVT: SCD's   Code Status: Full Family Communication: no family at bedside Disposition Plan: back to facility at discharge   Consultants:  GI  Procedures:  Colonoscopy: demonstrated internal/external hemorrhoids, diverticulosis and scattered polyps.  Antibiotics:  none  HPI/Subjective: Afebrile, no abd pain. One more episode of rectal bleeding throughout the night. No more bleeding today. Denies CP or SOB.  Objective: Filed Vitals:   10/08/12 2143 10/08/12 2351 10/09/12 0459 10/09/12 1300  BP: 132/55 142/68  98/53  Pulse: 60 53  57  Temp: 98.3 F (36.8 C)  98.1 F (36.7 C) 98.7 F (37.1 C)  TempSrc: Oral  Oral Oral  Resp: 18 16  18   Height:      Weight:      SpO2: 100% 100%  100%    Intake/Output Summary (Last 24 hours) at 10/09/12 1541 Last data filed at 10/09/12 1400  Gross per 24 hour  Intake    500 ml  Output      0 ml  Net    500 ml   Filed Weights   10/07/12 0921  Weight: 71.9 kg (158 lb 8.2 oz)    Exam:   General:  Afebrile,  NAD; reports she is feeling tire  Cardiovascular: S1 and S2, no rubs or gallops  Respiratory: CTA bilaterally  Abdomen: soft, NT, ND, positive BS  Musculoskeletal: no edema and no cyanosis  Data Reviewed: Basic Metabolic Panel:  Recent Labs Lab 10/07/12 0615 10/08/12 0450  NA 139 141  K 4.7 3.8  CL 105 113*  CO2 23 23  GLUCOSE 177* 108*  BUN 22 12  CREATININE 1.13* 0.88  CALCIUM 9.2 8.0*   Liver Function Tests:  Recent Labs Lab 10/07/12 0615  AST 18  ALT 15  ALKPHOS 107  BILITOT 0.3  PROT 6.8  ALBUMIN 3.6   CBC:  Recent Labs Lab 10/07/12 0615 10/07/12 1731 10/08/12 0450 10/08/12 2140 10/09/12 0455  WBC 7.1 7.0 5.9 9.3 6.6  NEUTROABS 5.5  --   --   --   --   HGB 11.8* 8.9* 7.9* 11.4* 9.0*  HCT 35.7* 27.0* 23.7* 34.6* 27.3*  MCV 92.0 91.5 91.2 84.9 85.0  PLT 224 178 148* 176 143*       Studies: No results found.  Scheduled Meds: . bimatoprost  1 drop Both Eyes QHS  . donepezil  10 mg Oral QHS  . dorzolamide  1 drop Both Eyes BID  . ferrous sulfate  325 mg Oral BID WC  . fluticasone  2 spray Each Nare Daily  . memantine  10 mg Oral Daily  . pilocarpine  1 drop Both Eyes QID  .  polyethylene glycol  17 g Oral Daily  . sodium chloride  3 mL Intravenous Q12H   Continuous Infusions: . sodium chloride 20 mL/hr at 10/08/12 2045    Principal Problem:   GI bleed Active Problems:   Dementia   Glaucoma   HLD (hyperlipidemia)   Allergic rhinitis    Time spent: >30 minutes    Theressa Piedra  [facility] Pager [phone]. If 7PM-7AM, please contact night-coverage at [email or web address], password [email or web address] 10/09/2012, 3:41 PM  LOS: 2 days

## 2012-10-09 NOTE — Progress Notes (Addendum)
Patient seen, examined, and I agree with the above documentation, including the assessment and plan. Seen for Dr. Elnoria Howard, [facility] Medical Presumed diverticular hemorrhage, which recurrent and now resolved bleeding overnight. Advance to low fiber diet Monitor hgb Tagged scan for any acute re-bleeding, then angio via IR if +

## 2012-10-09 NOTE — Progress Notes (Signed)
CSW continuing to follow for pt disposition planning needs for return to [facility].   CSW spoke with MD who stated that pt is not yet medically ready for discharge.  CSW to continue to follow and facilitate pt discharge needs back to [facility] when pt medically ready.  Jacklynn Lewis, MSW, LCSWA (coverage for [employer]) Clinical Social Work

## 2012-10-09 NOTE — Progress Notes (Signed)
Nutrition Note  Contacted by MD to provide low residue diet teaching.  Patient admitted with GI bleed probably diverticular.  Spoke with patient and patent's daughter.  Patient lives in a SNF memory unit.  Diet to be ordered on d/c orders and provided at [facility].  Patient tolerating clear liquid diet.  Requesting solids.  Diet order received and will advance.  Oran Rein, RD, LDN Clinical Inpatient Dietitian Pager:  [phone] Weekend and after hours pager:  [phone]

## 2012-10-09 NOTE — Progress Notes (Signed)
[facility] Progress Note  Subjective:  Had a moderate sized bloody stool around midnight with some dizziness.  Was given fluid bolus.  Only a small dot of blood since that time.  Wants to eat.  Objective:  Vital signs in last 24 hours: Temp:  [97.9 F (36.6 C)-98.6 F (37 C)] 98.3 F (36.8 C) (07/03 2143) Pulse Rate:  [47-72] 53 (07/03 2351) Resp:  [10-27] 16 (07/03 2351) BP: (99-142)/(47-75) 142/68 mmHg (07/03 2351) SpO2:  [97 %-100 %] 100 % (07/03 2351) Last BM Date: 10/08/12 General:   Alert, Well-developed, in NAD Heart:  Bradycardic, but regular rhythm; no murmurs Pulm:  CTAB.  No W/R/R. Abdomen:  Soft, nontender and nondistended. Normal bowel sounds, without guarding, and without rebound.   Extremities:  Without edema. Neurologic:  Alert and  oriented x4;  grossly normal neurologically. Psych:  Alert and cooperative. Normal mood and affect.  Intake/Output from previous day: 07/03 0701 - 07/04 0700 In: 927.1 [I.V.:600; Blood:327.1] Out: -   Lab Results:  Recent Labs  10/08/12 0450 10/08/12 2140 10/09/12 0455  WBC 5.9 9.3 6.6  HGB 7.9* 11.4* 9.0*  HCT 23.7* 34.6* 27.3*  PLT 148* 176 143*   BMET  Recent Labs  10/07/12 0615 10/08/12 0450  NA 139 141  K 4.7 3.8  CL 105 113*  CO2 23 23  GLUCOSE 177* 108*  BUN 22 12  CREATININE 1.13* 0.88  CALCIUM 9.2 8.0*   LFT  Recent Labs  10/07/12 0615  PROT 6.8  ALBUMIN 3.6  AST 18  ALT 15  ALKPHOS 107  BILITOT 0.3   PT/INR  Recent Labs  10/07/12 0615  LABPROT 13.4  INR 1.04   Assessment / Plan: -LGIB:  Likely diverticular.  Hopefully will resolve without intervention. -Acute blood loss anemia:  Secondary to above.  Hgb down 2.4 grams since last night, but also got fluid bolus so possibly somewhat dilutional drop.  *Continue to monitor Hgb closely and transfuse further if needed. *Remain on clear liquids for now and advance to fulls later today if no further bleeding throughout the  day. *NM bleeding scan +/- IR if brisk bleeding occurs.   LOS: 2 days   Robin Phillis D.  10/09/2012, 8:36 AM  Pager number [phone]

## 2012-10-10 LAB — CBC
Hemoglobin: 8.2 g/dL — ABNORMAL LOW (ref 12.0–15.0)
MCV: 85.2 fL (ref 78.0–100.0)
Platelets: 130 10*3/uL — ABNORMAL LOW (ref 150–400)
RBC: 2.9 MIL/uL — ABNORMAL LOW (ref 3.87–5.11)
WBC: 6.4 10*3/uL (ref 4.0–10.5)

## 2012-10-10 NOTE — Progress Notes (Signed)
[facility] Progress Note  Subjective:  Had a small amount of blood around 5 pm yesterday (about 50 cc's) and another episode around 7 pm yesterday (about 100 cc's), but no further bleeding O/N per nursing staff.  Eating low residue diet.  No dizziness or weakness.  Objective:  Vital signs in last 24 hours: Temp:  [98.6 F (37 C)-99.1 F (37.3 C)] 98.6 F (37 C) (07/05 0602) Pulse Rate:  [55-64] 55 (07/05 0602) Resp:  [18-20] 18 (07/05 0602) BP: (92-98)/(51-58) 95/58 mmHg (07/05 0602) SpO2:  [99 %-100 %] 99 % (07/05 0602) Last BM Date: 10/08/12 General:   Alert, Well-developed, in NAD; resting comfortably in bed. Heart:  Slightly bradycardic with regular rhythm; no murmurs Pulm:  CTAB.  No W/R/R. Abdomen:  Soft, nontender and nondistended. Normal bowel sounds, without guarding, and without rebound.   Extremities:  Without edema. Neurologic:  Alert and  oriented x4;  grossly normal neurologically.  Intake/Output from previous day: 07/04 0701 - 07/05 0700 In: 1140 [P.O.:680; I.V.:460] Out: -   Lab Results:  Recent Labs  10/09/12 0455 10/09/12 1645 10/10/12 0455  WBC 6.6 7.0 6.4  HGB 9.0* 8.5* 8.2*  HCT 27.3* 25.6* 24.7*  PLT 143* 140* 130*   BMET  Recent Labs  10/08/12 0450  NA 141  K 3.8  CL 113*  CO2 23  GLUCOSE 108*  BUN 12  CREATININE 0.88  CALCIUM 8.0*   Assessment / Plan: -LGIB: Likely diverticular. Hopefully will resolve without intervention and has definitely decreased signficantly. -Acute blood loss anemia: Secondary to above. Hgb continuing to drift down. -Dementia  *Continue to monitor Hgb closely and transfuse further if needed (may want to give her one more unit today).  *NM bleeding scan +/- IR if brisk bleeding occurs. *Has been started on ferrous sulfate 325 mg BID.    LOS: 3 days   ZEHR, JESSICA D.  10/10/2012, 9:00 AM  Pager number [phone]

## 2012-10-10 NOTE — Progress Notes (Signed)
[facility] PROGRESS NOTE  Robin Gregory ZOX:[id number] DOB: [birth year] DOA: 10/07/2012 PCP: Alva Garnet., MD  Assessment/Plan: 1-GI bleed: most likely lower GI bleed (internal hemorrhoids vs AVM vs diverticulosis vs malignancy)  -last episode of bleeding was last night -will start ferrous sulfate -per GI will continue supportive care and slowly advance diet -follow Hgb trend  -continue holding ASA  - if brisk bleeding occurs will need NM bleeding scan and if positive IR angio embolization.  2-Dementia:continue namenda and aricept   3-Glaucoma:continue current eyes drop therapy (lumigan, dorzolamide and pilocarpine)  4-HLD (hyperlipidemia):hx of HLD. Following low fat diet as an outpatient. Not on statins. Lipid panel with LDL 66, TG 38 and HDL 42  5-Allergic rhinitis:continue flonase and claritin  6-ABLA: Hgb 8.2 today.  -will continue close monitoring of Hgb -continue ferrous sulfate -transfuse if Hgb less than 8.0  DVT: SCD's   Code Status: Full Family Communication: no family at bedside Disposition Plan: back to facility at discharge   Consultants:  GI  Procedures:  Colonoscopy: demonstrated internal/external hemorrhoids, diverticulosis and scattered polyps.  Antibiotics:  none  HPI/Subjective: Afebrile, no abd pain. Had two small episodes of rectal bleeding around 5 PM/7 PM on 7/4; no further bleeding since then. Tolerating low residue diet. Denies CP or SOB.  Objective: Filed Vitals:   10/09/12 1300 10/09/12 2111 10/10/12 0602 10/10/12 1341  BP: 98/53 92/51 95/58  97/79  Pulse: 57 64 55 52  Temp: 98.7 F (37.1 C) 99.1 F (37.3 C) 98.6 F (37 C) 98.1 F (36.7 C)  TempSrc: Oral Oral Oral Oral  Resp: 18 20 18 16   Height:      Weight:      SpO2: 100% 100% 99% 99%    Intake/Output Summary (Last 24 hours) at 10/10/12 1407 Last data filed at 10/10/12 1335  Gross per 24 hour  Intake   1120 ml  Output    425 ml  Net    695 ml   Filed  Weights   10/07/12 0921  Weight: 71.9 kg (158 lb 8.2 oz)    Exam:   General:  Afebrile, NAD; reports she is feeling ok  Cardiovascular: S1 and S2, no rubs or gallops  Respiratory: CTA bilaterally  Abdomen: soft, NT, ND, positive BS  Musculoskeletal: no edema and no cyanosis  Data Reviewed: Basic Metabolic Panel:  Recent Labs Lab 10/07/12 0615 10/08/12 0450  NA 139 141  K 4.7 3.8  CL 105 113*  CO2 23 23  GLUCOSE 177* 108*  BUN 22 12  CREATININE 1.13* 0.88  CALCIUM 9.2 8.0*   Liver Function Tests:  Recent Labs Lab 10/07/12 0615  AST 18  ALT 15  ALKPHOS 107  BILITOT 0.3  PROT 6.8  ALBUMIN 3.6   CBC:  Recent Labs Lab 10/07/12 0615  10/08/12 0450 10/08/12 2140 10/09/12 0455 10/09/12 1645 10/10/12 0455  WBC 7.1  < > 5.9 9.3 6.6 7.0 6.4  NEUTROABS 5.5  --   --   --   --   --   --   HGB 11.8*  < > 7.9* 11.4* 9.0* 8.5* 8.2*  HCT 35.7*  < > 23.7* 34.6* 27.3* 25.6* 24.7*  MCV 92.0  < > 91.2 84.9 85.0 85.3 85.2  PLT 224  < > 148* 176 143* 140* 130*  < > = values in this interval not displayed.     Studies: No results found.  Scheduled Meds: . bimatoprost  1 drop Both Eyes QHS  . donepezil  10 mg Oral QHS  . dorzolamide  1 drop Both Eyes BID  . ferrous sulfate  325 mg Oral BID WC  . fluticasone  2 spray Each Nare Daily  . memantine  10 mg Oral Daily  . pilocarpine  1 drop Both Eyes QID  . polyethylene glycol  17 g Oral Daily  . sodium chloride  3 mL Intravenous Q12H   Continuous Infusions: . sodium chloride 20 mL/hr at 10/10/12 0132    Principal Problem:   GI bleed Active Problems:   Dementia   Glaucoma   HLD (hyperlipidemia)   Allergic rhinitis    Time spent: >30 minutes    [name]  [facility] Pager [phone]. If 7PM-7AM, please contact night-coverage at [email or web address], password [facility] 10/10/2012, 2:07 PM  LOS: 3 days

## 2012-10-10 NOTE — Progress Notes (Signed)
I agree with the above documentation, including the assessment and plan. Continue to follow hemoglobin, supportive care Agree with oral iron Tag red cell scan for brisk rebleeding, angiography if positive

## 2012-10-10 NOTE — Progress Notes (Addendum)
CSW received notification from MD that pt likely medically ready for discharge tomorrow, Sunday 7/6 and asked CSW to inquire if facility, [facility] can accept pt back tomorrow.  CSW contacted facility and left message at facility.  Awaiting response.  CSW to continue to follow.  Addendum 3:47pm: CSW was able to reach [facility] to discuss about pt likely being medically ready for discharge tomorrow. Per facility, facility has to assess pt before pt can return, but representative from facility will be available tomorrow and will contact w/e CSW to set up time for assessment. Once assessment completed by facility, pt will be able to return.   CSW to continue to follow.  Jacklynn Lewis, MSW, [employer]  Clinical Social Work Weekend coverage [phone]

## 2012-10-11 DIAGNOSIS — D62 Acute posthemorrhagic anemia: Secondary | ICD-10-CM

## 2012-10-11 LAB — CBC
HCT: 25.7 % — ABNORMAL LOW (ref 36.0–46.0)
Hemoglobin: 8.4 g/dL — ABNORMAL LOW (ref 12.0–15.0)
MCHC: 32.7 g/dL (ref 30.0–36.0)
MCV: 86 fL (ref 78.0–100.0)

## 2012-10-11 MED ORDER — ASPIRIN 81 MG PO CHEW: 81.0000 mg | CHEWABLE_TABLET | Freq: Every day | ORAL | Status: AC

## 2012-10-11 MED ORDER — POLYETHYLENE GLYCOL 3350 17 G PO PACK: 17.0000 g | PACK | Freq: Every day | ORAL | Status: AC

## 2012-10-11 MED ORDER — FERROUS SULFATE 325 (65 FE) MG PO TABS: 325.0000 mg | ORAL_TABLET | Freq: Two times a day (BID) | ORAL | Status: DC

## 2012-10-11 NOTE — Progress Notes (Signed)
[facility] Progress Note  Subjective:  Feels ok.  Says that she is tired and could use a cup of coffee.  Objective:  Vital signs in last 24 hours: Temp:  [98.1 F (36.7 C)-98.8 F (37.1 C)] 98.7 F (37.1 C) (07/06 0635) Pulse Rate:  [52-81] 67 (07/06 0635) Resp:  [16-20] 20 (07/06 0635) BP: (97-127)/(67-79) 127/69 mmHg (07/06 0635) SpO2:  [96 %-99 %] 96 % (07/06 0635) Last BM Date: 10/09/12 General:   Alert, Well-developed, in NAD Heart:  Regular rate and rhythm; no murmurs Pulm:  CTAB.  No W/R/R. Abdomen:  Soft, nontender and nondistended. Normal bowel sounds, without guarding, and without rebound.   Extremities:  Without edema. Neurologic:  Alert;  grossly normal neurologically. Psych:  Alert and cooperative. Normal mood and affect.  Intake/Output from previous day: 07/05 0701 - 07/06 0700 In: 1736 [P.O.:1440; I.V.:296] Out: 1375 [Urine:1375]  Lab Results:  Recent Labs  10/09/12 1645 10/10/12 0455 10/11/12 0500  WBC 7.0 6.4 6.6  HGB 8.5* 8.2* 8.4*  HCT 25.6* 24.7* 25.7*  PLT 140* 130* 164    Assessment / Plan: -LGIB: Likely diverticular.  Resolved at this point.  -Acute blood loss anemia: Secondary to above.  Hgb stable.  -Dementia   *Continue to monitor Hgb.  She will likely be discharged today so this should be watched as an outpatient. *Continue ferrous sulfate 325 mg BID.     LOS: 4 days   Sai Moura D.  10/11/2012, 8:31 AM  Pager number [phone]

## 2012-10-11 NOTE — Discharge Summary (Addendum)
Physician Discharge Summary  Robin Gregory NWG:[id number] DOB: [birth year] DOA: 10/07/2012  PCP: Alva Garnet., MD  Admit date: 10/07/2012 Discharge date: 10/11/2012  Time spent: >30 minutes  Recommendations for Outpatient Follow-up:  1. CBC to follow Hgb level 2. Perform EKG during follow up visit to guaranteed rate and rhythm remained stable.   Discharge Diagnoses:  Principal Problem:   GI bleed Active Problems:   Dementia   Glaucoma   HLD (hyperlipidemia)   Allergic rhinitis   Discharge Condition: stable and improved. No further bleeding episode in 36 hours. Patient with VS within normal limits and no orthostatic changes. Will discharge to [facility] facility and has been advise to follow with PCP in 5 days.  Diet recommendation: low fiber diet and low fat diet  Filed Weights   10/07/12 0921  Weight: 71.9 kg (158 lb 8.2 oz)    History of present illness:  79 y.o. female with PMH significant for glaucoma, HLD, dementia and allergic rhinitis; came to ED from SNF due to acute episodes of bright red blood per rectum. Per nursing home reports patient has to copious BM of pure blood; patient with small amount of blood per rectum in the ED as well. Patient denies abd pain, nausea, vomiting, diarrhea, constipation, fever, chills, CP, SOB or any other complaints. She has never had a colonoscopy. On admission Hgb 11.8 and BMET WNL, TR[facility] called to admit patient for further evaluation and treatment.   Hospital Course:  1-GI bleed: secondary to diverticulosis  -last episode of bleeding was approx 36 hours prior to discharge (about 50cc blood at that time); none since and with 2-3 BM after that episode.  -will discharge on ferrous sulfate and miralax. -patient instructed on low fiber diet -follow up with PCP in 5 days for close Hgb trend follow up. -at discharge Hgb 8.4 -hold ASA for 2 weeks  2-Dementia:continue supportive care namenda and aricept   3-Glaucoma: continue current eyes  drop therapy (lumigan, dorzolamide and pilocarpine)   4-HLD (hyperlipidemia):hx of HLD. Following low fat diet as an outpatient. Not on statins. Lipid panel with LDL 66, TG 38 and HDL 42   5-Allergic rhinitis:continue flonase and claritin   6-ABLA: Hgb 8.4 today.  -will continue close monitoring of Hgb (repeat in 5 days after discharge) -continue ferrous sulfate   Addendum: 7-Trigeminy: no sustained and asymptomatic. Never informed until after patient discharged. Electrolytes were WNL and most likely will not require any further treatment at this point. Will ask PCP to check EKG during follow up visit.  *Rest of medical problems remains stable and the plan is to continue current medication regimen.  Procedures:  Colonoscopy demonstrated internal/external hemorrhoids, diverticulosis and scattered polyps.  Consultations:  GI  Discharge Exam: Filed Vitals:   10/10/12 0602 10/10/12 1341 10/10/12 2152 10/11/12 0635  BP: 95/58 97/79 99/67  127/69  Pulse: 55 52 81 67  Temp: 98.6 F (37 C) 98.1 F (36.7 C) 98.8 F (37.1 C) 98.7 F (37.1 C)  TempSrc: Oral Oral Oral Oral  Resp: 18 16 20 20   Height:      Weight:      SpO2: 99% 99% 99% 96%   General: Afebrile, NAD; reports she is feeling ok; no further bleeding episodes in the las 36 hours. Tolerating low residue diet. Cardiovascular: S1 and S2, no rubs or gallops  Respiratory: CTA bilaterally  Abdomen: soft, NT, ND, positive BS  Musculoskeletal: no edema and no cyanosis  Discharge Instructions  Discharge Orders   Future Orders Complete  By Expires     Discharge instructions  As directed     Comments:      TAKE MEDICATIONS AS PRESCRIBED ARRANGE FOLLOW UP WITH PCP IN 5 DAYS FOLLOW A LOW RESIDUE DIET KEEP YOURSELF WELL HYDRATED HOLD ASPIRIN FOR THE NEXT 2 WEEKS        Medication List    STOP taking these medications       clindamycin 150 MG capsule  Commonly known as:  CLEOCIN     ibuprofen 400 MG tablet  Commonly  known as:  ADVIL,MOTRIN     loperamide 2 MG tablet  Commonly known as:  IMODIUM A-D      TAKE these medications       aspirin 81 MG chewable tablet  Chew 1 tablet (81 mg total) by mouth daily. HOLD FOR 2 WEEKS     bimatoprost 0.01 % Soln  Commonly known as:  LUMIGAN  Place 1 drop into both eyes at bedtime.     cholecalciferol 1000 UNITS tablet  Commonly known as:  VITAMIN D  Take 2,000 Units by mouth daily.     donepezil 10 MG tablet  Commonly known as:  ARICEPT  Take 10 mg by mouth at bedtime.     dorzolamide 2 % ophthalmic solution  Commonly known as:  TRUSOPT  Place 1 drop into both eyes 2 (two) times daily.     ferrous sulfate 325 (65 FE) MG tablet  Take 1 tablet (325 mg total) by mouth 2 (two) times daily with a meal.     fluticasone 50 MCG/ACT nasal spray  Commonly known as:  FLONASE  Place 2 sprays into the nose daily.     guaifenesin 100 MG/5ML syrup  Commonly known as:  ROBITUSSIN  Take 100 mg by mouth 4 (four) times daily as needed for cough or congestion.     guaiFENesin 600 MG 12 hr tablet  Commonly known as:  MUCINEX  Take 1 tablet (600 mg total) by mouth 2 (two) times daily.     loratadine 10 MG tablet  Commonly known as:  CLARITIN  Take 10 mg by mouth daily as needed for allergies.     memantine 10 MG tablet  Commonly known as:  NAMENDA  Take 10 mg by mouth daily.     pilocarpine 2 % ophthalmic solution  Commonly known as:  PILOCAR  Place 1 drop into both eyes 4 (four) times daily.     polyethylene glycol packet  Commonly known as:  MIRALAX / GLYCOLAX  Take 17 g by mouth daily.     prednisoLONE acetate 1 % ophthalmic suspension  Commonly known as:  PRED FORTE  Place 1 drop into both eyes as needed (as needed).       No Known Allergies     Follow-up Information   Follow up with Alva Garnet., MD. Schedule an appointment as soon as possible for a visit in 5 days.   Contact information:   [address] [place]  [place] [address] [phone]      Labs: Basic Metabolic Panel:  Recent Labs Lab 10/07/12 0615 10/08/12 0450  NA 139 141  K 4.7 3.8  CL 105 113*  CO2 23 23  GLUCOSE 177* 108*  BUN 22 12  CREATININE 1.13* 0.88  CALCIUM 9.2 8.0*   Liver Function Tests:  Recent Labs Lab 10/07/12 0615  AST 18  ALT 15  ALKPHOS 107  BILITOT 0.3  PROT 6.8  ALBUMIN 3.6  CBC:  Recent Labs Lab 10/07/12 0615  10/08/12 2140 10/09/12 0455 10/09/12 1645 10/10/12 0455 10/11/12 0500  WBC 7.1  < > 9.3 6.6 7.0 6.4 6.6  NEUTROABS 5.5  --   --   --   --   --   --   HGB 11.8*  < > 11.4* 9.0* 8.5* 8.2* 8.4*  HCT 35.7*  < > 34.6* 27.3* 25.6* 24.7* 25.7*  MCV 92.0  < > 84.9 85.0 85.3 85.2 86.0  PLT 224  < > 176 143* 140* 130* 164  < > = values in this interval not displayed.  Signed:  Domingos Riggi  [facility] 10/11/2012, 1:41 PM

## 2012-10-11 NOTE — Progress Notes (Signed)
Spoke with Lane Hacker at [facility].  Lane Hacker stated that they can accept Pt back if there are no meds that they can't get today.  Notified MD.  MD to do d/c Summary.  Faxed updated FL2 with meds.    Lane Hacker stated that the facility can accept Pt today.  Notified Pt and daughter.  Daughter to provide transportation.  Pt to be d/c'd.  Providence Crosby, LCSWA Clinical Social Work [phone]

## 2012-10-11 NOTE — Progress Notes (Addendum)
Upon review of tele strip RN notes that patient was in trigeminy this morning.  Patient exhibited no cardiac symptoms. CCM contacted to verify. RN received no notifications that patients heart rhythm had changed.  MD notified and will add note to discharge summary.

## 2012-10-11 NOTE — Progress Notes (Signed)
Discharge education completed by RN with pt daughter present. IV removed.  RN and patient signed two copies of discharge packet.  One copy provided to patient's daughter.  Patient and daughter have not further questions regarding discharge.  Room double checked for further belongings. 3 bottles of Eyedrops given to patients daughter. Patient transported to daughters car by staff member.  Pt to be transported to [facility] by daughter.

## 2012-10-11 NOTE — Progress Notes (Signed)
Nutrition f/u Brief Note  RD consulted regarding low residue diet education. Pt and pt's daughter were educated on 7/4 by RD. Suspect this consult was for her.   Pt and daughter report that RD answered questions on low residue diet. Pt is to be discharged to SNF today or tomorrow and is advised to continue low residue diet at facility. Facility will be provided with diet recommendations. Pt and daughter had no further nutritional concerns at this time  Body mass index is 26.38 kg/(m^2). Patient meets criteria for overweight based on current BMI.   Current diet order is low fiber. Labs and medications reviewed.   No nutrition interventions warranted at this time. If nutrition issues arise, please consult RD.   Ebbie Latus RD, LDN

## 2012-10-11 NOTE — Progress Notes (Signed)
I agree with the above documentation, including the assessment and plan.  

## 2012-10-11 NOTE — Progress Notes (Addendum)
Contacted [facility] to discuss Pt's possible return today.  Spoke with Dennie Bible.  Pat unaware that Pt is to be assessed today.  Dennie Bible stated that there's no one at the facility to do an assessment.  Pat to call around to inquire about Pt being assessed today.  CSW received a call from Dennie Bible stating that she has contacted several people and no one is available to do an assessment today.  Thus, Pt will not be able to be assessed until tomorrow.  Notified MD.  Notified Pt's daughter at bedside.  Pt's daughter not happy with this and will contact [facility] herself or have her husband go by there to talk with management about Pt returning today.  Daughter to notify RN is she is able to secure Pt returning to [place] today.  Providence Crosby, LCSWA Clinical Social Work [phone]

## 2013-10-15 ENCOUNTER — Emergency Department (HOSPITAL_COMMUNITY): Payer: Medicare Other

## 2013-10-15 ENCOUNTER — Emergency Department (HOSPITAL_COMMUNITY)
Admission: EM | Admit: 2013-10-15 | Discharge: 2013-10-16 | Disposition: A | Discharge status: Home / Self Care | Payer: Medicare Other | Attending: Emergency Medicine | Admitting: Emergency Medicine

## 2013-10-15 ENCOUNTER — Encounter (HOSPITAL_COMMUNITY): Payer: Self-pay | Admitting: Emergency Medicine

## 2013-10-15 DIAGNOSIS — Z862 Personal history of diseases of the blood and blood-forming organs and certain disorders involving the immune mechanism: Secondary | ICD-10-CM | POA: Insufficient documentation

## 2013-10-15 DIAGNOSIS — Z87448 Personal history of other diseases of urinary system: Secondary | ICD-10-CM | POA: Insufficient documentation

## 2013-10-15 DIAGNOSIS — I10 Essential (primary) hypertension: Secondary | ICD-10-CM | POA: Insufficient documentation

## 2013-10-15 DIAGNOSIS — Z8639 Personal history of other endocrine, nutritional and metabolic disease: Secondary | ICD-10-CM | POA: Insufficient documentation

## 2013-10-15 DIAGNOSIS — G893 Neoplasm related pain (acute) (chronic): Secondary | ICD-10-CM | POA: Insufficient documentation

## 2013-10-15 DIAGNOSIS — F0280 Dementia in other diseases classified elsewhere without behavioral disturbance: Secondary | ICD-10-CM | POA: Insufficient documentation

## 2013-10-15 DIAGNOSIS — IMO0002 Reserved for concepts with insufficient information to code with codable children: Secondary | ICD-10-CM | POA: Insufficient documentation

## 2013-10-15 DIAGNOSIS — R1030 Lower abdominal pain, unspecified: Secondary | ICD-10-CM

## 2013-10-15 DIAGNOSIS — Z7982 Long term (current) use of aspirin: Secondary | ICD-10-CM | POA: Insufficient documentation

## 2013-10-15 DIAGNOSIS — Z90710 Acquired absence of both cervix and uterus: Secondary | ICD-10-CM | POA: Insufficient documentation

## 2013-10-15 DIAGNOSIS — G309 Alzheimer's disease, unspecified: Secondary | ICD-10-CM | POA: Insufficient documentation

## 2013-10-15 DIAGNOSIS — Z79899 Other long term (current) drug therapy: Secondary | ICD-10-CM | POA: Insufficient documentation

## 2013-10-15 DIAGNOSIS — H409 Unspecified glaucoma: Secondary | ICD-10-CM | POA: Insufficient documentation

## 2013-10-15 DIAGNOSIS — R1084 Generalized abdominal pain: Secondary | ICD-10-CM | POA: Insufficient documentation

## 2013-10-15 MED ORDER — MORPHINE SULFATE 4 MG/ML IJ SOLN
4.0000 mg | Freq: Once | INTRAMUSCULAR | Status: AC
  Administered 2013-10-15: 4 mg via INTRAVENOUS
  Filled 2013-10-15: qty 1

## 2013-10-15 MED ORDER — IOHEXOL 300 MG/ML  SOLN
50.0000 mL | Freq: Once | INTRAMUSCULAR | Status: AC | PRN
  Administered 2013-10-15: 50 mL via ORAL

## 2013-10-15 MED ORDER — ONDANSETRON HCL 4 MG/2ML IJ SOLN
4.0000 mg | Freq: Once | INTRAMUSCULAR | Status: AC
  Administered 2013-10-15: 4 mg via INTRAVENOUS
  Filled 2013-10-15: qty 2

## 2013-10-15 NOTE — ED Provider Notes (Addendum)
CSN: [id number]     Arrival date & time 10/15/13  2306 History   First MD Initiated Contact with Patient 10/15/13 2309     Chief Complaint  Patient presents with  . Back Pain     (Consider location/radiation/quality/duration/timing/severity/associated sxs/prior Treatment) HPI Comments: The patient is a 80 year old female, history of a abdominal surgery many many years ago which was likely a hysterectomy according to the patient who presents from the nursing facility where she lives, she does have some dementia but states that she's been having several days of lower back pain which became more diffusely side and abdominal pain this evening. She has no nausea vomiting and denies diarrhea constipation dysuria hematuria or any weakness or numbness. She does not have any history of recent abdominal surgery. She describes her pain as cramping, sharp and stabbing, like somebody is boring into her stomach. She states that it is severe. She denies difficulty ambulating  Patient is a 80 y.o. female presenting with back pain. The history is provided by the patient, the EMS personnel and the nursing home.  Back Pain   Past Medical History  Diagnosis Date  . Glaucoma   . Hypertension   . Alzheimer's dementia   . Hyperlipidemia   . Overactive bladder    Past Surgical History  Procedure Laterality Date  . Abdominal hysterectomy    . Colonoscopy N/A 10/08/2012    Procedure: COLONOSCOPY;  Surgeon: Beryle Beams, MD;  Location: [facility] ENDOSCOPY;  Service: Endoscopy;  Laterality: N/A;   History reviewed. No pertinent family history. History  Substance Use Topics  . Smoking status: Never Smoker   . Smokeless tobacco: Never Used  . Alcohol Use: No   OB History   Grav Para Term Preterm Abortions TAB SAB Ect Mult Living                 Review of Systems  Unable to perform ROS: Dementia  Musculoskeletal: Positive for back pain.      Allergies  Review of patient's allergies indicates no known  allergies.  Home Medications   Prior to Admission medications   Medication Sig Start Date End Date Taking? Authorizing Provider  acetaminophen (TYLENOL) 325 MG tablet Take 650 mg by mouth every 6 (six) hours as needed for moderate pain.   Yes Historical Provider, MD  aspirin 81 MG chewable tablet Chew 1 tablet (81 mg total) by mouth daily. HOLD FOR 2 WEEKS 10/11/12  Yes Barton Dubois, MD  bimatoprost (LUMIGAN) 0.01 % SOLN Place 1 drop into both eyes at bedtime.   Yes Historical Provider, MD  cholecalciferol (VITAMIN D) 400 UNITS TABS tablet Take 400 Units by mouth daily.   Yes Historical Provider, MD  donepezil (ARICEPT) 5 MG tablet Take 5 mg by mouth at bedtime.   Yes Historical Provider, MD  dorzolamide (TRUSOPT) 2 % ophthalmic solution Place 1 drop into both eyes 2 (two) times daily.   Yes Historical Provider, MD  fluticasone (FLONASE) 50 MCG/ACT nasal spray Place 2 sprays into the nose daily. 07/06/12  Yes Peter S Dammen, PA-C  memantine (NAMENDA) 10 MG tablet Take 10 mg by mouth daily.    Yes Historical Provider, MD  pilocarpine (PILOCAR) 2 % ophthalmic solution Place 1 drop into both eyes 4 (four) times daily.   Yes Historical Provider, MD  polyethylene glycol (MIRALAX / GLYCOLAX) packet Take 17 g by mouth daily. 10/11/12  Yes Barton Dubois, MD  prednisoLONE acetate (PRED FORTE) 1 % ophthalmic suspension Place 1 drop  into both eyes as needed (as needed).   Yes Historical Provider, MD  Probiotic Product (ALIGN) 4 MG CAPS Take 1 capsule by mouth daily.   Yes Historical Provider, MD  PROTEIN PO Take 1 scoop by mouth 3 (three) times daily.   Yes Historical Provider, MD  guaifenesin (ROBITUSSIN) 100 MG/5ML syrup Take 100 mg by mouth 4 (four) times daily as needed for cough or congestion.    Historical Provider, MD  loratadine (CLARITIN) 10 MG tablet Take 10 mg by mouth daily as needed for allergies.     Historical Provider, MD   BP 147/69  Pulse 75  Temp(Src) 98.3 F (36.8 C) (Oral)  Resp 20   SpO2 98% Physical Exam  Nursing note and vitals reviewed. Constitutional: She appears well-developed and well-nourished. No distress.  HENT:  Head: Normocephalic and atraumatic.  Mouth/Throat: Oropharynx is clear and moist. No oropharyngeal exudate.  Eyes: Conjunctivae and EOM are normal. Pupils are equal, round, and reactive to light. Right eye exhibits no discharge. Left eye exhibits no discharge. No scleral icterus.  Neck: Normal range of motion. Neck supple. No JVD present. No thyromegaly present.  Cardiovascular: Normal rate, regular rhythm, normal heart sounds and intact distal pulses.  Exam reveals no gallop and no friction rub.   No murmur heard. Occasional ectopy  Pulmonary/Chest: Effort normal and breath sounds normal. No respiratory distress. She has no wheezes. She has no rales.  Abdominal: Soft. Bowel sounds are normal. She exhibits no distension and no mass. There is tenderness.  diffuse abdominal tenderness, no pulsating masses, no peritoneal signs, no guarding  Musculoskeletal: Normal range of motion. She exhibits no edema and no tenderness.  Lymphadenopathy:    She has no cervical adenopathy.  Neurological: She is alert. Coordination normal.  The patient is able to lift both legs off the bed to 45 against resistance without difficulty, normal strength and sensation of the bilateral lower extremities, cranial nerves III through XII appear to be intact, slight memory loss  Skin: Skin is warm and dry. No rash noted. No erythema.  Psychiatric: She has a normal mood and affect. Her behavior is normal.    ED Course  Procedures (including critical care time) Labs Review Labs Reviewed  COMPREHENSIVE METABOLIC PANEL - Abnormal; Notable for the following:    Glucose, Bld 122 (*)    GFR calc non Af Amer 74 (*)    GFR calc Af Amer 86 (*)    All other components within normal limits  URINALYSIS, ROUTINE W REFLEX MICROSCOPIC - Abnormal; Notable for the following:    APPearance  CLOUDY (*)    Glucose, UA 100 (*)    All other components within normal limits  CBC WITH DIFFERENTIAL  LIPASE, BLOOD  I-STAT CG4 LACTIC ACID, ED    Imaging Review Ct Abdomen Pelvis W Contrast  10/16/2013   CLINICAL DATA:  Low back pain, no known trauma  EXAM: CT ABDOMEN AND PELVIS WITH CONTRAST  TECHNIQUE: Multidetector CT imaging of the abdomen and pelvis was performed using the standard protocol following bolus administration of intravenous contrast.  CONTRAST:  32mL OMNIPAQUE IOHEXOL 300 MG/ML SOLN, 14mL OMNIPAQUE IOHEXOL 300 MG/ML SOLN  COMPARISON:  11/29/2005  FINDINGS: There is a pleural based mass at the right lung base measuring 7.6 x 5.4 by 8.0 cm, with adjacent osseous destruction and pathologic fracture involving predominantly the T12 vertebral body. Extension within the spinal canal is identified. There is also extension into the right paraspinal muscles. There is at least  50-60% narrowing of the spinal canal allowing for CT technique and cord compression is likely. For example, see image 16 series 2. Left lower lobe 6 mm nodule image 8 is stable. Adjacent 4 mm left lower lobe pulmonary nodule image 7 is stable. Two right middle lobe pulmonary nodules are identified on images 1 and 2, measuring 3-4 mm. This area was not included in the previous anatomic field of view.  Mild inhomogeneity of the liver parenchyma without measurable focal abnormality. The gallbladder is mildly distended but otherwise unremarkable. Adrenal glands, kidneys, spleen, and pancreas are normal. Dependent material is presumably present within the stomach. Mild atheromatous aortic calcification without aneurysm.  No free air, free fluid, or lymphadenopathy. Bladder is normal. Uterus and ovaries not visualized, presumed surgically absent. No bowel wall thickening or focal segmental dilatation. Small fat containing umbilical hernia.  No acute osseous abnormality. Degenerative change is identified in the lumbar spine and  hips.  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 Pathologic fractures evident as well as extension within the canal likely contributing to cord compression.  No CT evidence for intra-abdominal or pelvic metastatic disease.  Bilateral lower lung zone pulmonary nodules are reidentified, unchanged in the left lower lobe but not previously imaged at the right middle lobe. Further followup depends on the patient's overall treatment plan, but chest CT with contrast is recommended on a nonemergent outpatient basis as part of the patient's overall staging workup.  Critical Value/emergent results were called by telephone at the time of interpretation on 10/16/2013 at 1:30 AM to Dr. Noemi Chapel , who verbally acknowledged these results.   Electronically Signed   By: Conchita Paris M.D.   On: 10/16/2013 01:33     EKG Interpretation   Date/Time:  Friday October 15 2013 23:27:02 EDT Ventricular Rate:  86 PR Interval:  172 QRS Duration: 131 QT Interval:  431 QTC Calculation: 516 R Axis:   -33 Text Interpretation:  Sinus rhythm Atrial premature complexes Right bundle  branch block Baseline wander in lead(s) V1 Since last tracing ectopy now  present Confirmed by Shawnika Pepin  MD, Massai Hankerson ([id number]) on 10/16/2013 12:37:15 AM      MDM   Final diagnoses:  Lower abdominal pain  Tumor associated pain    The patient's vital signs show mild hypertension but no other significant findings, I do not feel a pulsating mass on exam to suggest an aneurysm but she does have diffuse abdominal tenderness and will need further workup for the source. She does have occasional ectopy, question arrhythmia, pain medications ordered.  Hassan Rowan Lumpkins - power of attorney - requests that the patient be immediately transferred back to her nursing facility to start her regimen for diverticulitis of which she was recently diagnosed  this week. She refuses any more medications or interventions including imaging. This seems reasonable given the patient's age, dementia and a recent thorough evaluation and diagnosis by her family doctor.  Repeat exam and VS - benign - non surgical abd, doubt abscess / perf, known tic's, stable for d/c.  CT scan was completed and showed a pleural based mass extending into the vertebral column and compressing the spinal cord.  I spoke with the daughter who states that she has been having symptoms of urinary incontinence and weakness of the legs for some time. This does not appear to be acute, the family member states that she will have her seen her  family doctor first thing on Monday morning to followup for further evaluation and possible surgical evaluation. The patient appears at baseline compared to prior reported activity according to the family member and can be discharged home.  Johnna Acosta, MD 10/16/13 4081  Johnna Acosta, MD 10/16/13 [phone]

## 2013-10-15 NOTE — ED Notes (Signed)
Per EMS pt comes from [facility](memory care unit)  c/o lower back pain denies trauma. Pt back pain is chronic staff states pt does not remember that this is a chronic condition. Pt given tylenol med ineffective. Pt is alert and oriented per her normal.

## 2013-10-16 LAB — COMPREHENSIVE METABOLIC PANEL
ALBUMIN: 3.5 g/dL (ref 3.5–5.2)
ALK PHOS: 101 U/L (ref 39–117)
ALT: 10 U/L (ref 0–35)
AST: 16 U/L (ref 0–37)
Anion gap: 14 (ref 5–15)
BILIRUBIN TOTAL: 0.5 mg/dL (ref 0.3–1.2)
BUN: 12 mg/dL (ref 6–23)
CHLORIDE: 102 meq/L (ref 96–112)
CO2: 24 mEq/L (ref 19–32)
Calcium: 9 mg/dL (ref 8.4–10.5)
Creatinine, Ser: 0.69 mg/dL (ref 0.50–1.10)
GFR calc Af Amer: 86 mL/min — ABNORMAL LOW (ref >90)
GFR calc non Af Amer: 74 mL/min — ABNORMAL LOW (ref >90)
Glucose, Bld: 122 mg/dL — ABNORMAL HIGH (ref 70–99)
POTASSIUM: 3.7 meq/L (ref 3.7–5.3)
Sodium: 140 mEq/L (ref 137–147)
TOTAL PROTEIN: 6.6 g/dL (ref 6.0–8.3)

## 2013-10-16 LAB — CBC WITH DIFFERENTIAL/PLATELET
BASOS PCT: 1 % (ref 0–1)
Basophils Absolute: 0 10*3/uL (ref 0.0–0.1)
Eosinophils Absolute: 0 10*3/uL (ref 0.0–0.7)
Eosinophils Relative: 1 % (ref 0–5)
HCT: 37.7 % (ref 36.0–46.0)
Hemoglobin: 12.5 g/dL (ref 12.0–15.0)
Lymphocytes Relative: 17 % (ref 12–46)
Lymphs Abs: 0.8 10*3/uL (ref 0.7–4.0)
MCH: 30.3 pg (ref 26.0–34.0)
MCHC: 33.2 g/dL (ref 30.0–36.0)
MCV: 91.3 fL (ref 78.0–100.0)
MONOS PCT: 7 % (ref 3–12)
Monocytes Absolute: 0.3 10*3/uL (ref 0.1–1.0)
NEUTROS ABS: 3.6 10*3/uL (ref 1.7–7.7)
Neutrophils Relative %: 74 % (ref 43–77)
PLATELETS: 279 10*3/uL (ref 150–400)
RBC: 4.13 MIL/uL (ref 3.87–5.11)
RDW: 15 % (ref 11.5–15.5)
WBC: 4.8 10*3/uL (ref 4.0–10.5)

## 2013-10-16 LAB — URINALYSIS, ROUTINE W REFLEX MICROSCOPIC
BILIRUBIN URINE: NEGATIVE
Glucose, UA: 100 mg/dL — AB
Hgb urine dipstick: NEGATIVE
Ketones, ur: NEGATIVE mg/dL
Leukocytes, UA: NEGATIVE
Nitrite: NEGATIVE
PH: 7 (ref 5.0–8.0)
Protein, ur: NEGATIVE mg/dL
SPECIFIC GRAVITY, URINE: 1.014 (ref 1.005–1.030)
UROBILINOGEN UA: 0.2 mg/dL (ref 0.0–1.0)

## 2013-10-16 LAB — LIPASE, BLOOD: Lipase: 15 U/L (ref 11–59)

## 2013-10-16 LAB — I-STAT CG4 LACTIC ACID, ED: LACTIC ACID, VENOUS: 1.39 mmol/L (ref 0.5–2.2)

## 2013-10-16 MED ORDER — IOHEXOL 300 MG/ML  SOLN
100.0000 mL | Freq: Once | INTRAMUSCULAR | Status: AC | PRN
  Administered 2013-10-16: 100 mL via INTRAVENOUS

## 2013-10-16 NOTE — Discharge Instructions (Signed)
Daughter requests immediate discharge from hospital without further testing.    Your CT scan shows a tumor growing from the chest and into your spine - please inform your doctor these results, you should be seen on Monday or Tuesday. Return to the hospital for severe or worsening symptoms. There was no sign of diverticulitis.

## 2013-10-16 NOTE — ED Notes (Signed)
Spoke to pt's daughter (POA) on the phone, she states that she did not authorize for the pt to be transported to the ED tonight.  She states "I am not paying any bill for tonight whatsoever."   She states that pt's med tech at [facility] told the paramedic that pt did not need transport to the ED.  Paramedic  Who transported pt reports that no one told him that pt does not need to be transported to the ED.  Pt was c/o back pain.  Daughter reports that pt convinced EMS that she has back pain and that she needed to be transported to the ED.   She also reports that she did not need to be transported because she was already given pain medicine for the pain.  Sabra Heck EDP also spoke to the daughter per his request

## 2013-10-16 NOTE — ED Notes (Signed)
Sabra Heck EDP made aware of patient Robin Gregory Lactic results.

## 2014-04-08 DEATH — deceased

## 2015-04-17 IMAGING — CT CT ABD-PELV W/ CM
1 of 3 series · 13 of 32 positions shown, 18 images · IV contrast (OMNIPAQUE 300)
Comparison: 11/29/2005

CLINICAL DATA: Low back pain, no known trauma

EXAM:
CT ABDOMEN AND PELVIS WITH CONTRAST
TECHNIQUE: Multidetector CT imaging of the abdomen and pelvis was performed
using the standard protocol following bolus administration of
intravenous contrast.
CONTRAST:  50mL OMNIPAQUE IOHEXOL 300 MG/ML SOLN, 100mL OMNIPAQUE
IOHEXOL 300 MG/ML SOLN

[Series 2: abd/pel with · axial · 0.77mm/px · z∈[-420,-50]mm · 13 of 84 slices shown, 18 images]
[im 5/84  soft-tissue]
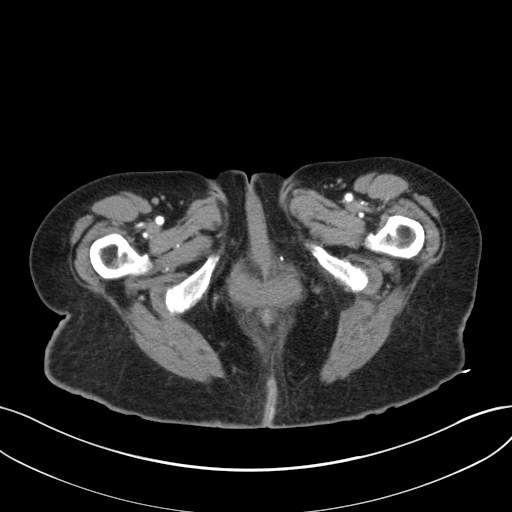
[im 5/84  bone]
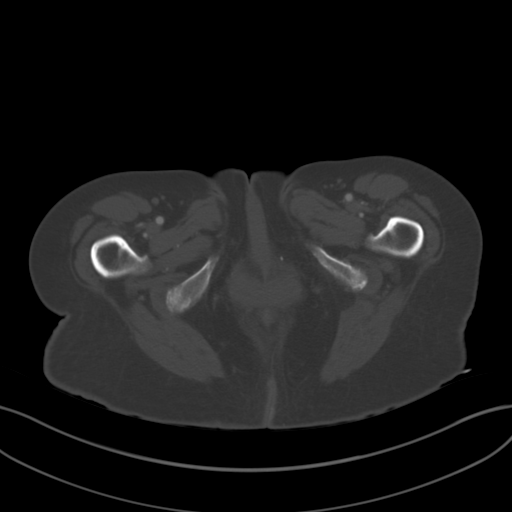
[im 14/84  soft-tissue]
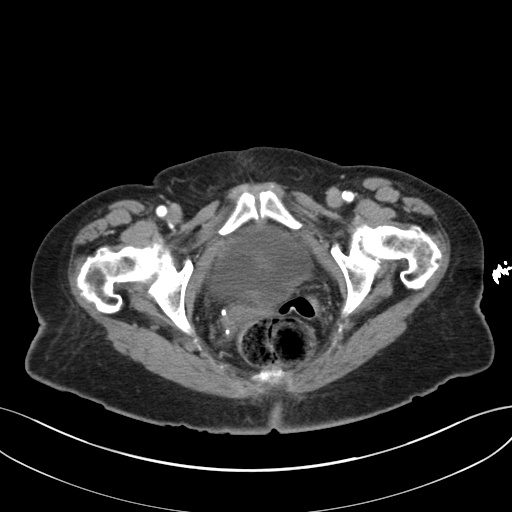
[im 19/84  soft-tissue]
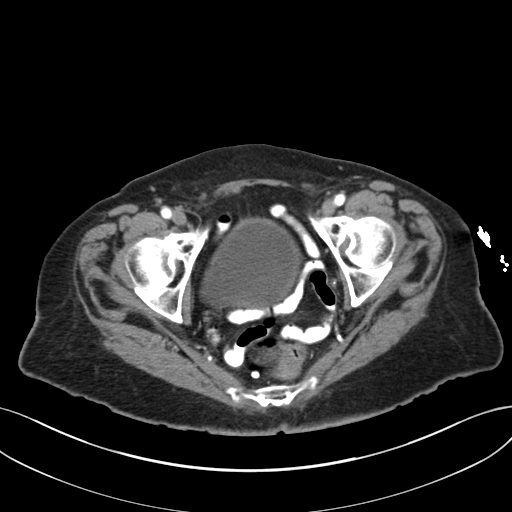
[im 24/84  soft-tissue]
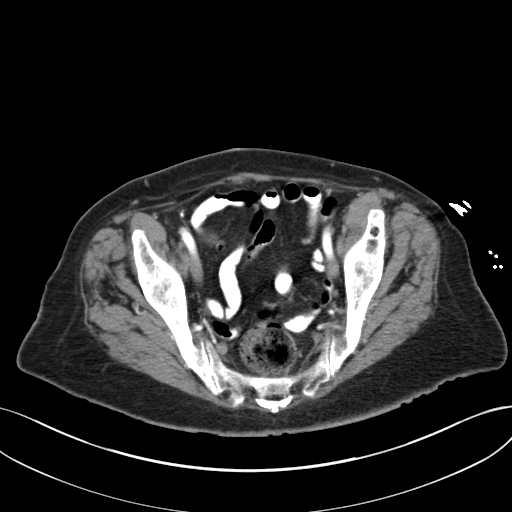
[im 33/84  soft-tissue]
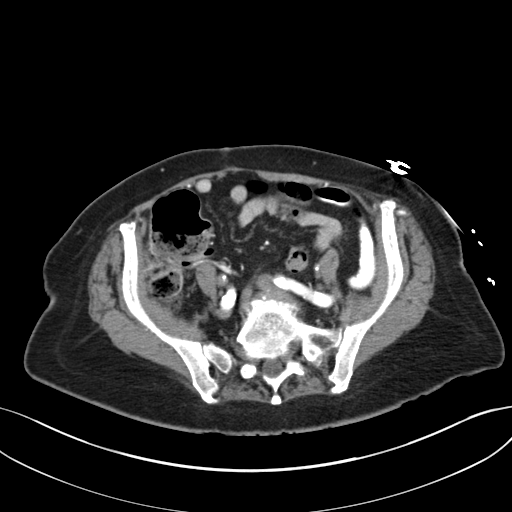
[im 37/84  soft-tissue]
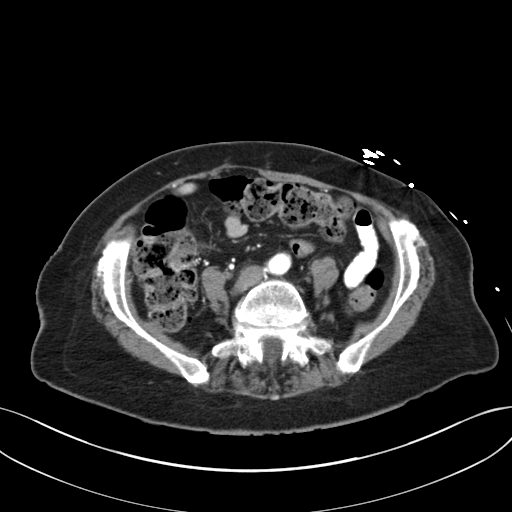
[im 47/84  soft-tissue]
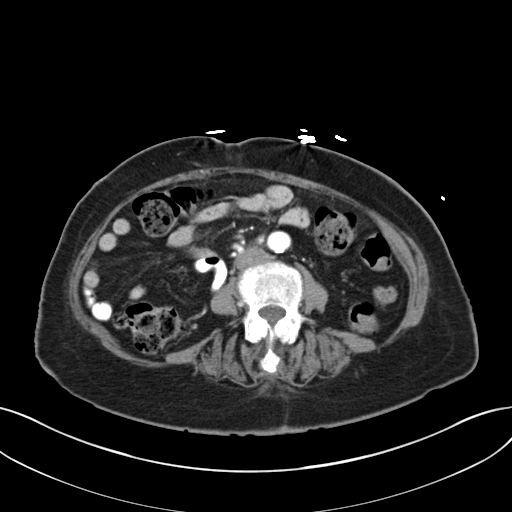
[im 51/84  soft-tissue]
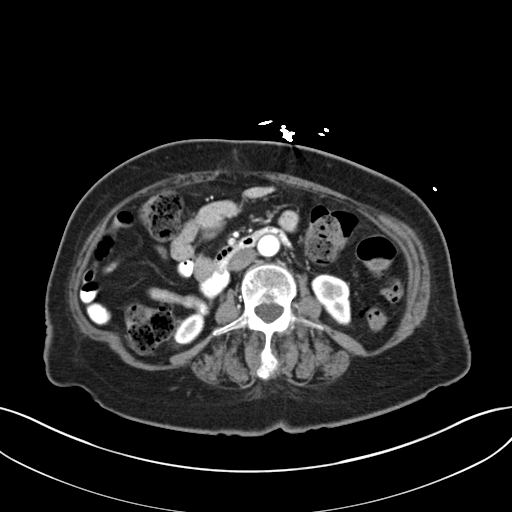
[im 60/84  soft-tissue]
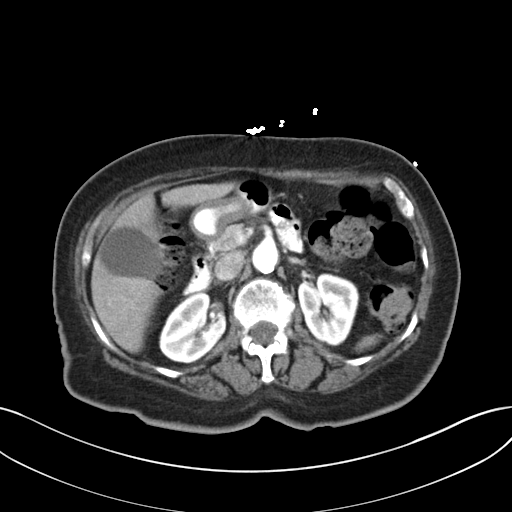
[im 60/84  bone]
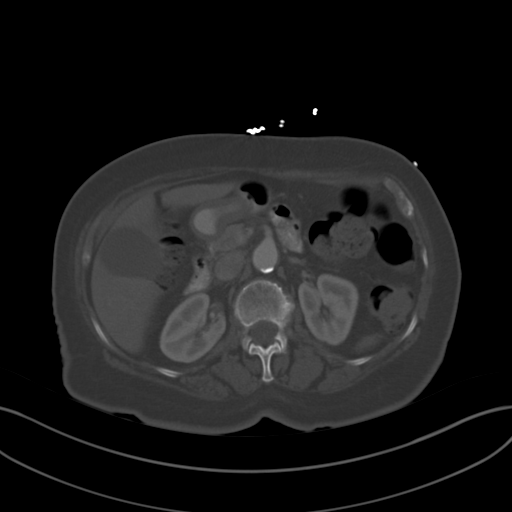
[im 65/84  soft-tissue]
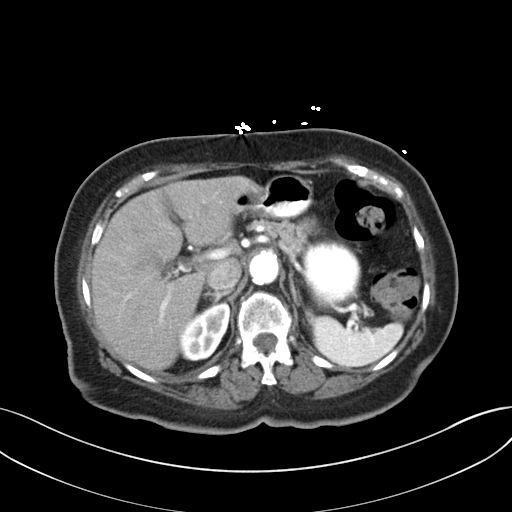
[im 65/84  lung]
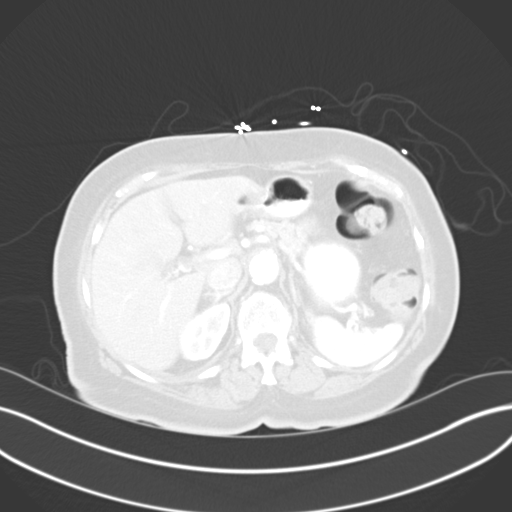
[im 70/84  soft-tissue]
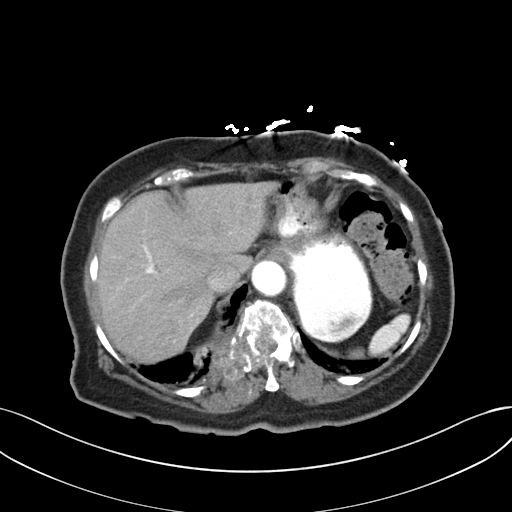
[im 70/84  lung]
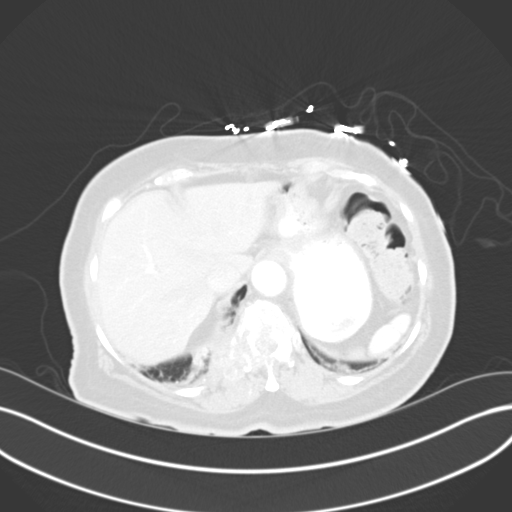
[im 74/84  lung]
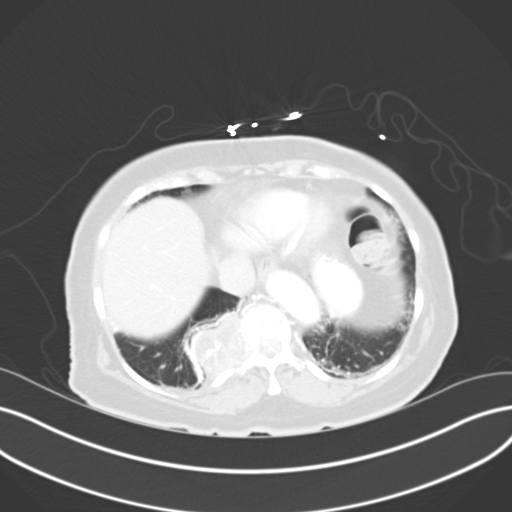
[im 79/84  soft-tissue]
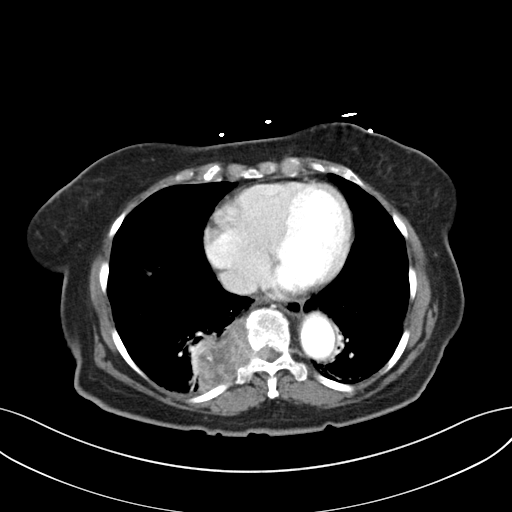
[im 79/84  lung]
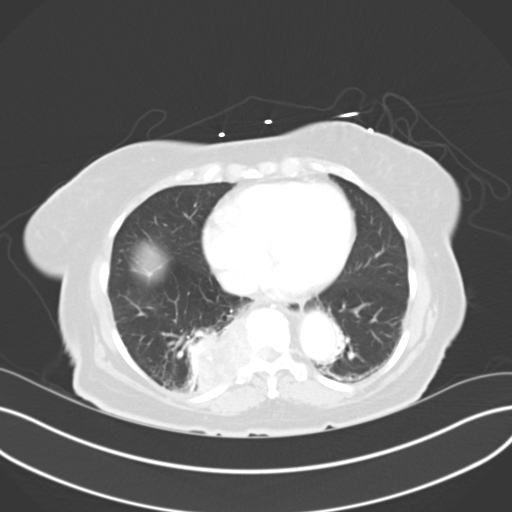

[13 of 32 positions shown; findings below may reference images not displayed]

FINDINGS: There is a pleural based mass at the right lung base measuring 7.6 x
5.4 by 8.0 cm, with adjacent osseous destruction and pathologic
fracture involving predominantly the T12 vertebral body. Extension
within the spinal canal is identified. There is also extension into
the right paraspinal muscles. There is at least 50-60% narrowing of
the spinal canal allowing for CT technique and cord compression is
likely. For example, see image 16 series 2. Left lower lobe 6 mm
nodule image 8 is stable. Adjacent 4 mm left lower lobe pulmonary
nodule image 7 is stable. Two right middle lobe pulmonary nodules
are identified on images 1 and 2, measuring 3-4 mm. This area was
not included in the previous anatomic field of view.

Mild inhomogeneity of the liver parenchyma without measurable focal
abnormality. The gallbladder is mildly distended but otherwise
unremarkable. Adrenal glands, kidneys, spleen, and pancreas are
normal. Dependent material is presumably present within the stomach.
Mild atheromatous aortic calcification without aneurysm.

No free air, free fluid, or lymphadenopathy. Bladder is normal.
Uterus and ovaries not visualized, presumed surgically absent. No
bowel wall thickening or focal segmental dilatation. Small fat
containing umbilical hernia.

No acute osseous abnormality. Degenerative change is identified in
the lumbar spine and hips.
IMPRESSION: Large right lower lobe pleural based mass highly suspicious for
malignancy, with destruction of adjacent bone most prominent at the
T12 level. Pathologic fractures evident as well as extension within
the canal likely contributing to cord compression.

No CT evidence for intra-abdominal or pelvic metastatic disease.

Bilateral lower lung zone pulmonary nodules are reidentified,
unchanged in the left lower lobe but not previously imaged at the
right middle lobe. Further followup depends on the patient's overall
treatment plan, but chest CT with contrast is recommended on a
nonemergent outpatient basis as part of the patient's overall
staging workup.

Critical Value/emergent results were called by telephone at the time
of interpretation on 10/16/2013 at [DATE] to Dr. BOBBIE ZULUAGA , who
verbally acknowledged these results.
